# Patient Record
Sex: Male | Born: 1985 | Race: Black or African American | Hispanic: No | Marital: Married | Smoking: Former smoker
Health system: Southern US, Community
[De-identification: ages and names within clinical notes are randomized; demographics above are authoritative.]

## PROBLEM LIST (undated history)

## (undated) ENCOUNTER — Emergency Department: Discharge status: Absent without leave | Payer: BLUE CROSS/BLUE SHIELD

## (undated) DIAGNOSIS — J189 Pneumonia, unspecified organism: Secondary | ICD-10-CM

## (undated) DIAGNOSIS — R112 Nausea with vomiting, unspecified: Secondary | ICD-10-CM

## (undated) DIAGNOSIS — R310 Gross hematuria: Secondary | ICD-10-CM

## (undated) DIAGNOSIS — S7292XA Unspecified fracture of left femur, initial encounter for closed fracture: Secondary | ICD-10-CM

## (undated) DIAGNOSIS — Z9889 Other specified postprocedural states: Secondary | ICD-10-CM

## (undated) HISTORY — PX: FRACTURE SURGERY: SHX138

## (undated) HISTORY — PX: HERNIA REPAIR: SHX51

## (undated) SURGERY — Surgical Case
Anesthesia: *Unknown

---

## 2005-12-07 ENCOUNTER — Emergency Department: Payer: Self-pay | Admitting: Emergency Medicine

## 2006-05-02 ENCOUNTER — Emergency Department: Payer: Self-pay | Admitting: Emergency Medicine

## 2012-10-15 ENCOUNTER — Emergency Department: Payer: Self-pay | Admitting: Emergency Medicine

## 2012-10-15 LAB — URINALYSIS, COMPLETE
Bacteria: NONE SEEN
Blood: NEGATIVE
Glucose,UR: NEGATIVE mg/dL (ref 0–75)
Ketone: NEGATIVE
Protein: NEGATIVE
Specific Gravity: 1.019 (ref 1.003–1.030)
WBC UR: <1 /HPF (ref 0–5)

## 2012-10-15 LAB — BASIC METABOLIC PANEL
Anion Gap: 4 — ABNORMAL LOW (ref 7–16)
BUN: 17 mg/dL (ref 7–18)
Calcium, Total: 9.6 mg/dL (ref 8.5–10.1)
Co2: 29 mmol/L (ref 21–32)
Creatinine: 1.08 mg/dL (ref 0.60–1.30)
EGFR (Non-African Amer.): >60
Potassium: 4.3 mmol/L (ref 3.5–5.1)
Sodium: 136 mmol/L (ref 136–145)

## 2012-10-15 LAB — CBC
HCT: 44.1 % (ref 40.0–52.0)
HGB: 14.8 g/dL (ref 13.0–18.0)
MCH: 28.8 pg (ref 26.0–34.0)
Platelet: 230 10*3/uL (ref 150–440)
WBC: 11 10*3/uL — ABNORMAL HIGH (ref 3.8–10.6)

## 2012-10-25 ENCOUNTER — Emergency Department: Payer: Self-pay | Admitting: Emergency Medicine

## 2012-10-25 LAB — URINALYSIS, COMPLETE
Bacteria: NONE SEEN
Bilirubin,UR: NEGATIVE
Glucose,UR: NEGATIVE mg/dL (ref 0–75)
Ketone: NEGATIVE
Leukocyte Esterase: NEGATIVE
Nitrite: NEGATIVE
Protein: NEGATIVE
RBC,UR: 1 /HPF (ref 0–5)
Squamous Epithelial: NONE SEEN

## 2012-10-25 LAB — BASIC METABOLIC PANEL
Anion Gap: 2 — ABNORMAL LOW (ref 7–16)
Calcium, Total: 9.6 mg/dL (ref 8.5–10.1)
Chloride: 104 mmol/L (ref 98–107)
Co2: 32 mmol/L (ref 21–32)
Creatinine: 1.03 mg/dL (ref 0.60–1.30)
EGFR (African American): >60
EGFR (Non-African Amer.): >60
Glucose: 78 mg/dL (ref 65–99)
Osmolality: 276 (ref 275–301)
Potassium: 4.3 mmol/L (ref 3.5–5.1)

## 2012-10-25 LAB — CBC WITH DIFFERENTIAL/PLATELET
Basophil #: 0.1 10*3/uL (ref 0.0–0.1)
Basophil %: 0.8 %
Eosinophil %: 5.1 %
HCT: 44.1 % (ref 40.0–52.0)
HGB: 15 g/dL (ref 13.0–18.0)
Lymphocyte %: 36.8 %
MCHC: 34.1 g/dL (ref 32.0–36.0)
MCV: 86 fL (ref 80–100)
Monocyte #: 0.6 x10 3/mm (ref 0.2–1.0)
Monocyte %: 5.9 %
Neutrophil %: 51.4 %
Platelet: 278 10*3/uL (ref 150–440)
RDW: 13.4 % (ref 11.5–14.5)
WBC: 9.3 10*3/uL (ref 3.8–10.6)

## 2014-01-19 HISTORY — PX: INGUINAL HERNIA REPAIR: SUR1180

## 2017-04-10 ENCOUNTER — Other Ambulatory Visit: Payer: Self-pay

## 2017-04-10 ENCOUNTER — Ambulatory Visit
Admission: EM | Admit: 2017-04-10 | Discharge: 2017-04-10 | Disposition: A | Discharge status: Home / Self Care | Payer: BLUE CROSS/BLUE SHIELD | Attending: Emergency Medicine | Admitting: Emergency Medicine

## 2017-04-10 ENCOUNTER — Encounter: Payer: Self-pay | Admitting: *Deleted

## 2017-04-10 DIAGNOSIS — J111 Influenza due to unidentified influenza virus with other respiratory manifestations: Secondary | ICD-10-CM

## 2017-04-10 DIAGNOSIS — R69 Illness, unspecified: Secondary | ICD-10-CM | POA: Diagnosis not present

## 2017-04-10 DIAGNOSIS — R05 Cough: Secondary | ICD-10-CM | POA: Diagnosis not present

## 2017-04-10 DIAGNOSIS — M791 Myalgia, unspecified site: Secondary | ICD-10-CM

## 2017-04-10 DIAGNOSIS — R509 Fever, unspecified: Secondary | ICD-10-CM

## 2017-04-10 MED ORDER — ACETAMINOPHEN 500 MG PO TABS
1000.0000 mg | ORAL_TABLET | Freq: Once | ORAL | Status: AC
Start: 2017-04-10 — End: 2017-04-10
  Administered 2017-04-10: 1000 mg via ORAL

## 2017-04-10 MED ORDER — OSELTAMIVIR PHOSPHATE 75 MG PO CAPS: 75.0000 mg | ORAL_CAPSULE | Freq: Two times a day (BID) | ORAL | 0 refills | Status: DC

## 2017-04-10 MED ORDER — FLUTICASONE PROPIONATE 50 MCG/ACT NA SUSP: 1.0000 | Freq: Every day | NASAL | 2 refills | Status: DC

## 2017-04-10 NOTE — Discharge Instructions (Signed)
-  Tamiflu: 1 tablet twice a day for 5 days -Flonase: 1 spray per nostril daily -Push fluids and rest -Alternate ibuprofen and Tylenol every 3-4 hours for pain and fever -Can continue over-the-counter medications for symptom management -Return to clinic should symptoms worsen or not improve.

## 2017-04-10 NOTE — ED Triage Notes (Signed)
Patient started having symptoms of cough chest congestion, body aches, fever and nausea 2 days ago.

## 2017-04-10 NOTE — ED Provider Notes (Signed)
[facility]    CSN: [id number] Arrival date & time: 04/10/17  1052     History   Chief Complaint Chief Complaint  Patient presents with  . Cough  . Generalized Body Aches  . Fever    HPI Wesley Sandoval is a 32 y.o. male.   Patient is a 32 year old male who presents with complaint of stomachache, body aches, chills, nausea, headache, and congestion started about a day and a half ago.  Also had some ear pain with opening closing his mouth earlier but that is better.  He states he did not get the flu shot this year and he denies any known sick contacts.  He does report some sternal chest pain with cough and deep breaths.  Patient also reports some abdominal pain off and on and states he did have some diarrhea this morning.  He did take some DayQuil yesterday without much help.  He also reporting he is going from chills sweating and back-and-forth.      History reviewed. No pertinent past medical history.  There are no active problems to display for this patient.   Past Surgical History:  Procedure Laterality Date  . HERNIA REPAIR         Home Medications    Prior to Admission medications   Medication Sig Start Date End Date Taking? Authorizing Provider  fluticasone (FLONASE) 50 MCG/ACT nasal spray Place 1 spray into both nostrils daily. 04/10/17   Candis SchatzHarris, Kaylenn Civil D, PA-C  oseltamivir (TAMIFLU) 75 MG capsule Take 1 capsule (75 mg total) by mouth every 12 (twelve) hours. 04/10/17   Candis SchatzHarris, Marwa Fuhrman D, PA-C    Family History Family History  Adopted: Yes    Social History Social History   Tobacco Use  . Smoking status: Former Games developermoker  . Smokeless tobacco: Never Used  Substance Use Topics  . Alcohol use: No    Frequency: Never  . Drug use: No     Allergies   Patient has no known allergies.   Review of Systems Review of Systems  As noted above in HPI.  Other systems reviewed and found to be negative   Physical Exam Triage Vital Signs ED  Triage Vitals [04/10/17 1111]  Enc Vitals Group     BP 124/86     Pulse Rate (!) 107     Resp 16     Temp (!) 101.2 F (38.4 C)     Temp Source Oral     SpO2 98 %     Weight 220 lb (99.8 kg)     Height 6' (1.829 m)     Head Circumference      Peak Flow      Pain Score      Pain Loc      Pain Edu?      Excl. in GC?    No data found.  Updated Vital Signs BP 124/86 (BP Location: Right Arm)   Pulse (!) 107   Temp (!) 101.2 F (38.4 C) (Oral)   Resp 16   Ht 6' (1.829 m)   Wt 220 lb (99.8 kg)   SpO2 98%   BMI 29.84 kg/m    Physical Exam  Constitutional: He is oriented to person, place, and time. He appears well-developed. No distress.  HENT:  Head: Atraumatic.  Right Ear: Ear canal normal. A middle ear effusion is present.  Left Ear: Ear canal normal. A middle ear effusion is present.  Nose: Right sinus exhibits maxillary sinus tenderness.  Right sinus exhibits no frontal sinus tenderness. Left sinus exhibits maxillary sinus tenderness. Left sinus exhibits no frontal sinus tenderness.  Mouth/Throat: Uvula is midline. Tonsils are 0 on the right. Tonsils are 0 on the left.  Some postnasal drainage.  Eyes: EOM are normal. Pupils are equal, round, and reactive to light.  Cardiovascular: Regular rhythm.  Tachycardia  Pulmonary/Chest: Effort normal and breath sounds normal. No stridor. No respiratory distress. He has no wheezes.  Abdominal: Soft.  Musculoskeletal: Normal range of motion.  Lymphadenopathy:    He has cervical adenopathy.  Neurological: He is alert and oriented to person, place, and time. No cranial nerve deficit.  Skin: Skin is warm and dry.  Psychiatric: He has a normal mood and affect. His behavior is normal.     UC Treatments / Results  Labs (all labs ordered are listed, but only abnormal results are displayed) Labs Reviewed - No data to display  EKG  EKG Interpretation None       Radiology No results found.  Procedures Procedures  (including critical care time)  Medications Ordered in UC Medications  acetaminophen (TYLENOL) tablet 1,000 mg (1,000 mg Oral Given 04/10/17 1206)     Initial Impression / Assessment and Plan / UC Course  I have reviewed the triage vital signs and the nursing notes.  Pertinent labs & imaging results that were available during my care of the patient were reviewed by me and considered in my medical decision making (see chart for details).     Patient with flulike symptoms as well as signs upon examination.  Patient with noted fever in clinic today.  Final Clinical Impressions(s) / UC Diagnoses   Final diagnoses:  Influenza-like illness  Fever, unspecified   Will treat patient for flulike illness with Tamiflu and also give him a prescription for Flonase.  Recommend continuing rotation of ibuprofen and Tylenol for pain and fever.  Advised rest and pushing of fluids.  Patient advised to return to clinic should his symptoms worsen or not improve  ED Discharge Orders        Ordered    oseltamivir (TAMIFLU) 75 MG capsule  Every 12 hours     04/10/17 1207    fluticasone (FLONASE) 50 MCG/ACT nasal spray  Daily     04/10/17 1207       Controlled Substance Prescriptions [place] Controlled Substance Registry consulted? Not Applicable   Candis Schatz, PA-C 04/10/17 1210

## 2017-04-13 ENCOUNTER — Telehealth: Payer: Self-pay

## 2017-04-13 NOTE — Telephone Encounter (Signed)
Called to follow up with patient since visit here at [facility]. Patient instructed to call back with any questions or concerns. [facility]  

## 2017-04-21 DIAGNOSIS — R310 Gross hematuria: Secondary | ICD-10-CM

## 2017-04-21 HISTORY — DX: Gross hematuria: R31.0

## 2017-05-14 ENCOUNTER — Ambulatory Visit
Admission: EM | Admit: 2017-05-14 | Discharge: 2017-05-14 | Disposition: A | Discharge status: Home / Self Care | Payer: BLUE CROSS/BLUE SHIELD | Attending: Family Medicine | Admitting: Family Medicine

## 2017-05-14 ENCOUNTER — Encounter: Payer: Self-pay | Admitting: Emergency Medicine

## 2017-05-14 ENCOUNTER — Other Ambulatory Visit: Payer: Self-pay

## 2017-05-14 ENCOUNTER — Ambulatory Visit
Admission: RE | Admit: 2017-05-14 | Discharge: 2017-05-14 | Disposition: A | Discharge status: Home / Self Care | Payer: BLUE CROSS/BLUE SHIELD | Source: Ambulatory Visit | Attending: Family Medicine | Admitting: Family Medicine

## 2017-05-14 DIAGNOSIS — R0789 Other chest pain: Secondary | ICD-10-CM

## 2017-05-14 DIAGNOSIS — R05 Cough: Secondary | ICD-10-CM | POA: Diagnosis not present

## 2017-05-14 DIAGNOSIS — R079 Chest pain, unspecified: Secondary | ICD-10-CM | POA: Diagnosis not present

## 2017-05-14 DIAGNOSIS — R310 Gross hematuria: Secondary | ICD-10-CM | POA: Insufficient documentation

## 2017-05-14 DIAGNOSIS — Z9889 Other specified postprocedural states: Secondary | ICD-10-CM | POA: Insufficient documentation

## 2017-05-14 DIAGNOSIS — K5730 Diverticulosis of large intestine without perforation or abscess without bleeding: Secondary | ICD-10-CM | POA: Insufficient documentation

## 2017-05-14 DIAGNOSIS — Z87891 Personal history of nicotine dependence: Secondary | ICD-10-CM | POA: Insufficient documentation

## 2017-05-14 DIAGNOSIS — Z8489 Family history of other specified conditions: Secondary | ICD-10-CM | POA: Insufficient documentation

## 2017-05-14 DIAGNOSIS — K381 Appendicular concretions: Secondary | ICD-10-CM | POA: Diagnosis not present

## 2017-05-14 LAB — CBC WITH DIFFERENTIAL/PLATELET
Basophils Absolute: 0.1 10*3/uL (ref 0–0.1)
Basophils Relative: 1 %
Eosinophils Absolute: 0.1 10*3/uL (ref 0–0.7)
Eosinophils Relative: 2 %
HCT: 46.3 % (ref 40.0–52.0)
HEMOGLOBIN: 15.8 g/dL (ref 13.0–18.0)
LYMPHS ABS: 2 10*3/uL (ref 1.0–3.6)
LYMPHS PCT: 34 %
MCH: 29.6 pg (ref 26.0–34.0)
MCHC: 34.1 g/dL (ref 32.0–36.0)
MCV: 86.7 fL (ref 80.0–100.0)
MONOS PCT: 8 %
Monocytes Absolute: 0.4 10*3/uL (ref 0.2–1.0)
NEUTROS PCT: 55 %
Neutro Abs: 3.2 10*3/uL (ref 1.4–6.5)
Platelets: 214 10*3/uL (ref 150–440)
RBC: 5.33 MIL/uL (ref 4.40–5.90)
RDW: 13.5 % (ref 11.5–14.5)
WBC: 5.8 10*3/uL (ref 3.8–10.6)

## 2017-05-14 LAB — URINALYSIS, COMPLETE (UACMP) WITH MICROSCOPIC
BILIRUBIN URINE: NEGATIVE
Glucose, UA: NEGATIVE mg/dL
Ketones, ur: NEGATIVE mg/dL
LEUKOCYTES UA: NEGATIVE
NITRITE: NEGATIVE
PH: 5.5 (ref 5.0–8.0)
Protein, ur: 30 mg/dL — AB
SPECIFIC GRAVITY, URINE: 1.025 (ref 1.005–1.030)
WBC, UA: NONE SEEN WBC/hpf (ref 0–5)

## 2017-05-14 LAB — COMPREHENSIVE METABOLIC PANEL
ALK PHOS: 60 U/L (ref 38–126)
ALT: 24 U/L (ref 17–63)
ANION GAP: 5 (ref 5–15)
AST: 22 U/L (ref 15–41)
Albumin: 4.2 g/dL (ref 3.5–5.0)
BUN: 15 mg/dL (ref 6–20)
CALCIUM: 9.2 mg/dL (ref 8.9–10.3)
CO2: 26 mmol/L (ref 22–32)
CREATININE: 1.08 mg/dL (ref 0.61–1.24)
Chloride: 104 mmol/L (ref 101–111)
Glucose, Bld: 96 mg/dL (ref 65–99)
Potassium: 3.9 mmol/L (ref 3.5–5.1)
Sodium: 135 mmol/L (ref 135–145)
Total Bilirubin: 0.8 mg/dL (ref 0.3–1.2)
Total Protein: 7.2 g/dL (ref 6.5–8.1)

## 2017-05-14 NOTE — Discharge Instructions (Signed)
We will call with the results.  Tylenol/ibuprofen as needed for you chest discomfort.  Take care  Dr. Adriana Simasook

## 2017-05-14 NOTE — ED Triage Notes (Signed)
Patient states that he noticed blood in his urine this morning.  Patient denies burning when urinating. Patient c/o cough and chest congestion for 3 weeks.  Patient denies fevers.

## 2017-05-14 NOTE — ED Provider Notes (Signed)
[facility]    CSN: [id number] Arrival date & time: 05/14/17  1214  History   Chief Complaint Chief Complaint  Patient presents with  . Hematuria  . Cough    HPI  32 year old male presents with hematuria and chest pain.  Patient reports that he has had hematuria as of early this morning.  Gross hematuria.  No reports of current flank pain.  He states that last week he had a bout of left flank pain.  No urinary frequency, urgency, or dysuria.  No drainage from the penis.  He is not concerned about STDs.  No reports of abdominal pain.  No known exacerbating or relieving factors.  No other associated symptoms.  Additionally, the patient has had ongoing chest pain.  Sternal.  Worse with range of motion and leaning forward, particular in the morning.  Intermittent. No pain currently.  No known relieving factors.  No reports of shortness of breath.  No other associated symptoms.  No other complaints at this time.  PMH - Hx of Pneumonia  Past Surgical History:  Procedure Laterality Date  . HERNIA REPAIR      Home Medications    Prior to Admission medications   Not on File    Family History Family History  Adopted: Yes    Social History Social History   Tobacco Use  . Smoking status: Former Games developermoker  . Smokeless tobacco: Never Used  Substance Use Topics  . Alcohol use: No    Frequency: Never  . Drug use: No     Allergies   Patient has no known allergies.   Review of Systems Review of Systems  Constitutional: Negative.   Cardiovascular: Positive for chest pain.  Genitourinary: Positive for hematuria.   Physical Exam Triage Vital Signs ED Triage Vitals  Enc Vitals Group     BP 05/14/17 1240 116/74     Pulse Rate 05/14/17 1240 70     Resp 05/14/17 1240 16     Temp 05/14/17 1240 98.7 F (37.1 C)     Temp Source 05/14/17 1240 Oral     SpO2 05/14/17 1240 99 %     Weight 05/14/17 1238 200 lb (90.7 kg)     Height 05/14/17 1238 6' (1.829 m)   Head Circumference --      Peak Flow --      Pain Score 05/14/17 1238 2     Pain Loc --      Pain Edu? --      Excl. in GC? --    Updated Vital Signs BP 116/74 (BP Location: Left Arm)   Pulse 70   Temp 98.7 F (37.1 C) (Oral)   Resp 16   Ht 6' (1.829 m)   Wt 200 lb (90.7 kg)   SpO2 99%   BMI 27.12 kg/m     Physical Exam  Constitutional: He is oriented to person, place, and time. He appears well-developed and well-nourished. No distress.  HENT:  Head: Normocephalic and atraumatic.  Eyes: Conjunctivae are normal. Right eye exhibits no discharge. Left eye exhibits no discharge.  Cardiovascular: Normal rate and regular rhythm.  No murmur heard. Pulmonary/Chest: Effort normal and breath sounds normal. No respiratory distress. He exhibits no tenderness.  Abdominal: Soft. He exhibits no distension. There is no tenderness.  Neurological: He is alert and oriented to person, place, and time.  Psychiatric: He has a normal mood and affect. His behavior is normal.  Nursing note and vitals reviewed.  UC Treatments /  Results  Labs (all labs ordered are listed, but only abnormal results are displayed) Labs Reviewed  URINALYSIS, COMPLETE (UACMP) WITH MICROSCOPIC - Abnormal; Notable for the following components:      Result Value   Hgb urine dipstick LARGE (*)    Protein, ur 30 (*)    Squamous Epithelial / LPF 0-5 (*)    Bacteria, UA RARE (*)    All other components within normal limits  CBC WITH DIFFERENTIAL/PLATELET  COMPREHENSIVE METABOLIC PANEL    EKG Interpretation: Normal sinus rhythm with a rate of 67.  Normal axis.  Normal intervals.  No ST or T wave changes.  Normal EKG.  Radiology No results found.  Procedures Procedures (including critical care time)  Medications Ordered in UC Medications - No data to display   Initial Impression / Assessment and Plan / UC Course  I have reviewed the triage vital signs and the nursing notes.  Pertinent labs & imaging results  that were available during my care of the patient were reviewed by me and considered in my medical decision making (see chart for details).    32 year old male presents with gross hematuria.  He also reports ongoing chest pain.  EKG negative.  Does not appear to be cardiac in nature.  Likely musculoskeletal.  Advised supportive care.  Regarding his hematuria gross hematuria.  Confirmed hematuria today on microscopy.  Patient is currently asymptomatic.  He has had recent flank pain.  Per guidelines, patient needs further workup.  His labs today were unremarkable.  Patient will need a CT and cystoscopy if CT is negative.  Arranging CT today.  Final Clinical Impressions(s) / UC Diagnoses   Final diagnoses:  Gross hematuria    ED Discharge Orders        Ordered    CT RENAL STONE STUDY     05/14/17 1342     Controlled Substance Prescriptions [place] Controlled Substance Registry consulted? Not Applicable   Tommie Sams, DO 05/14/17 1347

## 2017-05-15 NOTE — ED Notes (Signed)
Called for CT Authorization. No Authorization required for procedure. [facility]

## 2018-01-02 ENCOUNTER — Emergency Department
Admission: EM | Admit: 2018-01-02 | Discharge: 2018-01-02 | Disposition: A | Discharge status: Home / Self Care | Payer: BLUE CROSS/BLUE SHIELD | Attending: Student in an Organized Health Care Education/Training Program | Admitting: Student in an Organized Health Care Education/Training Program

## 2018-01-02 ENCOUNTER — Other Ambulatory Visit: Payer: Self-pay

## 2018-01-02 DIAGNOSIS — J029 Acute pharyngitis, unspecified: Secondary | ICD-10-CM | POA: Diagnosis not present

## 2018-01-02 DIAGNOSIS — J069 Acute upper respiratory infection, unspecified: Secondary | ICD-10-CM

## 2018-01-02 DIAGNOSIS — B9789 Other viral agents as the cause of diseases classified elsewhere: Secondary | ICD-10-CM | POA: Insufficient documentation

## 2018-01-02 DIAGNOSIS — Z87891 Personal history of nicotine dependence: Secondary | ICD-10-CM | POA: Diagnosis not present

## 2018-01-02 DIAGNOSIS — R070 Pain in throat: Secondary | ICD-10-CM | POA: Diagnosis present

## 2018-01-02 LAB — GROUP A STREP BY PCR: GROUP A STREP BY PCR: NOT DETECTED

## 2018-01-02 MED ORDER — PREDNISONE 10 MG (21) PO TBPK: ORAL_TABLET | ORAL | 0 refills | Status: DC

## 2018-01-02 MED ORDER — FLUTICASONE PROPIONATE 50 MCG/ACT NA SUSP: 2.0000 | Freq: Every day | NASAL | 0 refills | Status: DC

## 2018-01-02 MED ORDER — ACETAMINOPHEN 325 MG PO TABS
650.0000 mg | ORAL_TABLET | Freq: Once | ORAL | Status: AC | PRN
  Administered 2018-01-02: 650 mg via ORAL
  Filled 2018-01-02: qty 2

## 2018-01-02 MED ORDER — BENZONATATE 100 MG PO CAPS: ORAL_CAPSULE | ORAL | 0 refills | Status: DC

## 2018-01-02 MED ORDER — ONDANSETRON 4 MG PO TBDP
4.0000 mg | ORAL_TABLET | Freq: Once | ORAL | Status: AC
  Administered 2018-01-02: 4 mg via ORAL
  Filled 2018-01-02: qty 1

## 2018-01-02 MED ORDER — DEXAMETHASONE SODIUM PHOSPHATE 10 MG/ML IJ SOLN
10.0000 mg | Freq: Once | INTRAMUSCULAR | Status: AC
  Administered 2018-01-02: 10 mg via INTRAMUSCULAR
  Filled 2018-01-02: qty 1

## 2018-01-02 NOTE — ED Triage Notes (Signed)
Pt states sore throat, nausea, congestion, cold chills x 1 day. Took dayquil at 3p. States painful to swallow. A&O x 4, ambulatory. No distress noted.

## 2018-01-02 NOTE — Discharge Instructions (Addendum)
Your exam was essentially normal, and your strep test is negative. You likely have a viral upper respiratory infection. Take the prescription meds as directed. You may continue your OTC DayQuil as well as Benadryl as discussed. Consider OTC Delsym for additional cough relief. Follow-up with [facility] for ongoing symptoms.

## 2018-01-02 NOTE — ED Provider Notes (Signed)
[facility] Emergency Department Provider Note ____________________________________________  Time seen: 2015  I have reviewed the triage vital signs and the nursing notes.  HISTORY  Chief Complaint  Sore Throat  HPI Wesley Sandoval is a 32 y.o. male resents himself to the ED for evaluation of 1 day complaint of subjective fevers, cough, congestion, and sore throat.  Patient denies any sick contacts, recent travel, or other exposures.  He describes itchy scratchy posterior throat and postnasal drainage.  He denies any significant cough, cough induced vomiting, or shortness of breath.  He has taken 2 pills of DayQuil for symptom relief.  He denies any significant improvement of his overall symptoms.  History reviewed. No pertinent past medical history.  There are no active problems to display for this patient.  Past Surgical History:  Procedure Laterality Date  . HERNIA REPAIR      Prior to Admission medications   Medication Sig Start Date End Date Taking? Authorizing Provider  benzonatate (TESSALON PERLES) 100 MG capsule Take 1-2 tabs TID prn cough 01/02/18   Reba Hulett, Charlesetta Ivory, PA-C  fluticasone (FLONASE) 50 MCG/ACT nasal spray Place 2 sprays into both nostrils daily. 01/02/18   Imad Shostak, Charlesetta Ivory, PA-C  predniSONE (STERAPRED UNI-PAK 21 TAB) 10 MG (21) TBPK tablet 6-day taper as directed. 01/02/18   Clarece Drzewiecki, Charlesetta Ivory, PA-C    Allergies Patient has no known allergies.  Family History  Adopted: Yes    Social History Social History   Tobacco Use  . Smoking status: Former Games developer  . Smokeless tobacco: Never Used  Substance Use Topics  . Alcohol use: No    Frequency: Never  . Drug use: No    Review of Systems  Constitutional: Negative for fever. Eyes: Negative for visual changes. ENT: Positive for sore throat. Cardiovascular: Negative for chest pain. Respiratory: Negative for shortness of breath. Reports cough  Gastrointestinal:  Negative for abdominal pain, vomiting and diarrhea. Genitourinary: Negative for dysuria. Musculoskeletal: Negative for back pain. Skin: Negative for rash. Neurological: Negative for headaches, focal weakness or numbness. ____________________________________________  PHYSICAL EXAM:  VITAL SIGNS: ED Triage Vitals [01/02/18 1829]  Enc Vitals Group     BP 117/70     Pulse Rate (!) 110     Resp 18     Temp 100 F (37.8 C)     Temp Source Oral     SpO2 97 %     Weight 220 lb (99.8 kg)     Height 5\' 9"  (1.753 m)     Head Circumference      Peak Flow      Pain Score 8     Pain Loc      Pain Edu?      Excl. in GC?     Constitutional: Alert and oriented. Well appearing and in no distress. Head: Normocephalic and atraumatic. Eyes: Conjunctivae are normal. PERRL. Normal extraocular movements Ears: Canals clear. TMs intact bilaterally. Nose: No congestion/rhinorrhea/epistaxis. Nasal turbinates are pink and moist.  Mouth/Throat: Mucous membranes are moist. Uvula is midline and tonsils are flat and without erythema, edema, or exudates. Neck: Supple. No thyromegaly. Hematological/Lymphatic/Immunological: No cervical lymphadenopathy. Cardiovascular: Normal rate, regular rhythm. Normal distal pulses. Respiratory: Normal respiratory effort. No wheezes/rales/rhonchi. ____________________________________________   LABS (pertinent positives/negatives)  Labs Reviewed  GROUP A STREP BY PCR  ____________________________________________  PROCEDURES  Procedures Tylenol 650 mg PO Decadron 10 mg IM Zofran 4 mg ODT  ____________________________________________  INITIAL IMPRESSION / ASSESSMENT AND PLAN /  ED COURSE  Patient with ED evaluation of sudden onset of sore throat and post-nasal drainage. His strep PCR is negative. Symptoms likely represent a viral etiology. He will be discharged with prescriptions for tessalon perles, Flonase, and prednisone. He should take OTC Delsym and Benadryl  for additional symptom relief.  ____________________________________________  FINAL CLINICAL IMPRESSION(S) / ED DIAGNOSES  Final diagnoses:  Sore throat  Viral URI with cough      Meaghann Choo, Charlesetta Ivory, PA-C 01/03/18 0004    Willy Eddy, MD 01/05/18 [phone]

## 2018-01-02 NOTE — ED Notes (Signed)
See triage note  Presents with sore throat,nausea and body aches  States sx's started last pm  Febrile on arrival

## 2018-01-13 ENCOUNTER — Emergency Department (HOSPITAL_COMMUNITY): Payer: BLUE CROSS/BLUE SHIELD | Admitting: Anesthesiology

## 2018-01-13 ENCOUNTER — Encounter (HOSPITAL_COMMUNITY): Admission: EM | Disposition: A | Discharge status: Home Health | Payer: Self-pay | Source: Home / Self Care

## 2018-01-13 ENCOUNTER — Encounter (HOSPITAL_COMMUNITY): Payer: Self-pay | Admitting: Emergency Medicine

## 2018-01-13 ENCOUNTER — Inpatient Hospital Stay (HOSPITAL_COMMUNITY): Payer: BLUE CROSS/BLUE SHIELD

## 2018-01-13 ENCOUNTER — Emergency Department (HOSPITAL_COMMUNITY): Payer: BLUE CROSS/BLUE SHIELD

## 2018-01-13 ENCOUNTER — Inpatient Hospital Stay (HOSPITAL_COMMUNITY)
Admission: EM | Admit: 2018-01-13 | Discharge: 2018-01-25 | DRG: 956 | Disposition: A | Discharge status: Home Health | Payer: BLUE CROSS/BLUE SHIELD | Attending: General Surgery | Admitting: General Surgery

## 2018-01-13 DIAGNOSIS — S272XXA Traumatic hemopneumothorax, initial encounter: Secondary | ICD-10-CM | POA: Diagnosis not present

## 2018-01-13 DIAGNOSIS — Z23 Encounter for immunization: Secondary | ICD-10-CM | POA: Diagnosis not present

## 2018-01-13 DIAGNOSIS — S27322A Contusion of lung, bilateral, initial encounter: Secondary | ICD-10-CM | POA: Diagnosis present

## 2018-01-13 DIAGNOSIS — E8770 Fluid overload, unspecified: Secondary | ICD-10-CM | POA: Diagnosis not present

## 2018-01-13 DIAGNOSIS — I959 Hypotension, unspecified: Secondary | ICD-10-CM | POA: Diagnosis present

## 2018-01-13 DIAGNOSIS — G8918 Other acute postprocedural pain: Secondary | ICD-10-CM

## 2018-01-13 DIAGNOSIS — N179 Acute kidney failure, unspecified: Secondary | ICD-10-CM | POA: Diagnosis present

## 2018-01-13 DIAGNOSIS — E875 Hyperkalemia: Secondary | ICD-10-CM | POA: Diagnosis present

## 2018-01-13 DIAGNOSIS — D62 Acute posthemorrhagic anemia: Secondary | ICD-10-CM

## 2018-01-13 DIAGNOSIS — R000 Tachycardia, unspecified: Secondary | ICD-10-CM | POA: Diagnosis not present

## 2018-01-13 DIAGNOSIS — J189 Pneumonia, unspecified organism: Secondary | ICD-10-CM | POA: Diagnosis not present

## 2018-01-13 DIAGNOSIS — E876 Hypokalemia: Secondary | ICD-10-CM | POA: Diagnosis not present

## 2018-01-13 DIAGNOSIS — S82202C Unspecified fracture of shaft of left tibia, initial encounter for open fracture type IIIA, IIIB, or IIIC: Secondary | ICD-10-CM | POA: Diagnosis present

## 2018-01-13 DIAGNOSIS — S72462C Displaced supracondylar fracture with intracondylar extension of lower end of left femur, initial encounter for open fracture type IIIA, IIIB, or IIIC: Principal | ICD-10-CM | POA: Diagnosis present

## 2018-01-13 DIAGNOSIS — S82401A Unspecified fracture of shaft of right fibula, initial encounter for closed fracture: Secondary | ICD-10-CM

## 2018-01-13 DIAGNOSIS — T148XXA Other injury of unspecified body region, initial encounter: Secondary | ICD-10-CM | POA: Diagnosis not present

## 2018-01-13 DIAGNOSIS — Y95 Nosocomial condition: Secondary | ICD-10-CM

## 2018-01-13 DIAGNOSIS — R739 Hyperglycemia, unspecified: Secondary | ICD-10-CM | POA: Diagnosis not present

## 2018-01-13 DIAGNOSIS — S82202B Unspecified fracture of shaft of left tibia, initial encounter for open fracture type I or II: Secondary | ICD-10-CM

## 2018-01-13 DIAGNOSIS — D696 Thrombocytopenia, unspecified: Secondary | ICD-10-CM | POA: Diagnosis not present

## 2018-01-13 DIAGNOSIS — Z4659 Encounter for fitting and adjustment of other gastrointestinal appliance and device: Secondary | ICD-10-CM

## 2018-01-13 DIAGNOSIS — S82252C Displaced comminuted fracture of shaft of left tibia, initial encounter for open fracture type IIIA, IIIB, or IIIC: Secondary | ICD-10-CM

## 2018-01-13 DIAGNOSIS — M7989 Other specified soft tissue disorders: Secondary | ICD-10-CM | POA: Diagnosis not present

## 2018-01-13 DIAGNOSIS — Z7289 Other problems related to lifestyle: Secondary | ICD-10-CM | POA: Diagnosis not present

## 2018-01-13 DIAGNOSIS — S299XXA Unspecified injury of thorax, initial encounter: Secondary | ICD-10-CM

## 2018-01-13 DIAGNOSIS — F1010 Alcohol abuse, uncomplicated: Secondary | ICD-10-CM | POA: Diagnosis not present

## 2018-01-13 DIAGNOSIS — S82401B Unspecified fracture of shaft of right fibula, initial encounter for open fracture type I or II: Secondary | ICD-10-CM | POA: Diagnosis present

## 2018-01-13 DIAGNOSIS — D72829 Elevated white blood cell count, unspecified: Secondary | ICD-10-CM

## 2018-01-13 DIAGNOSIS — S7292XC Unspecified fracture of left femur, initial encounter for open fracture type IIIA, IIIB, or IIIC: Secondary | ICD-10-CM

## 2018-01-13 DIAGNOSIS — Z978 Presence of other specified devices: Secondary | ICD-10-CM

## 2018-01-13 DIAGNOSIS — S2243XS Multiple fractures of ribs, bilateral, sequela: Secondary | ICD-10-CM

## 2018-01-13 DIAGNOSIS — S2241XA Multiple fractures of ribs, right side, initial encounter for closed fracture: Secondary | ICD-10-CM

## 2018-01-13 DIAGNOSIS — Z9289 Personal history of other medical treatment: Secondary | ICD-10-CM

## 2018-01-13 DIAGNOSIS — T794XXA Traumatic shock, initial encounter: Secondary | ICD-10-CM | POA: Diagnosis present

## 2018-01-13 DIAGNOSIS — Z4682 Encounter for fitting and adjustment of non-vascular catheter: Secondary | ICD-10-CM

## 2018-01-13 DIAGNOSIS — S7292XA Unspecified fracture of left femur, initial encounter for closed fracture: Secondary | ICD-10-CM

## 2018-01-13 DIAGNOSIS — J9811 Atelectasis: Secondary | ICD-10-CM | POA: Diagnosis not present

## 2018-01-13 DIAGNOSIS — S82251B Displaced comminuted fracture of shaft of right tibia, initial encounter for open fracture type I or II: Secondary | ICD-10-CM | POA: Diagnosis present

## 2018-01-13 DIAGNOSIS — S82402B Unspecified fracture of shaft of left fibula, initial encounter for open fracture type I or II: Secondary | ICD-10-CM

## 2018-01-13 DIAGNOSIS — T1490XA Injury, unspecified, initial encounter: Secondary | ICD-10-CM

## 2018-01-13 DIAGNOSIS — S82201A Unspecified fracture of shaft of right tibia, initial encounter for closed fracture: Secondary | ICD-10-CM

## 2018-01-13 DIAGNOSIS — T07XXXA Unspecified multiple injuries, initial encounter: Secondary | ICD-10-CM

## 2018-01-13 DIAGNOSIS — J9601 Acute respiratory failure with hypoxia: Secondary | ICD-10-CM | POA: Diagnosis present

## 2018-01-13 DIAGNOSIS — Z419 Encounter for procedure for purposes other than remedying health state, unspecified: Secondary | ICD-10-CM

## 2018-01-13 DIAGNOSIS — S36892A Contusion of other intra-abdominal organs, initial encounter: Secondary | ICD-10-CM | POA: Diagnosis present

## 2018-01-13 DIAGNOSIS — Y92410 Unspecified street and highway as the place of occurrence of the external cause: Secondary | ICD-10-CM

## 2018-01-13 DIAGNOSIS — S2243XA Multiple fractures of ribs, bilateral, initial encounter for closed fracture: Secondary | ICD-10-CM | POA: Diagnosis present

## 2018-01-13 DIAGNOSIS — R0902 Hypoxemia: Secondary | ICD-10-CM

## 2018-01-13 DIAGNOSIS — J939 Pneumothorax, unspecified: Secondary | ICD-10-CM

## 2018-01-13 DIAGNOSIS — S99919A Unspecified injury of unspecified ankle, initial encounter: Secondary | ICD-10-CM

## 2018-01-13 HISTORY — DX: Pneumonia, unspecified organism: J18.9

## 2018-01-13 HISTORY — DX: Gross hematuria: R31.0

## 2018-01-13 HISTORY — PX: I & D EXTREMITY: SHX5045

## 2018-01-13 HISTORY — PX: EXTERNAL FIXATION LEG: SHX1549

## 2018-01-13 LAB — COMPREHENSIVE METABOLIC PANEL
ALK PHOS: 58 U/L (ref 38–126)
ALT: 341 U/L — ABNORMAL HIGH (ref 0–44)
AST: 416 U/L — AB (ref 15–41)
Albumin: 3.7 g/dL (ref 3.5–5.0)
Anion gap: 13 (ref 5–15)
BILIRUBIN TOTAL: 1.4 mg/dL — AB (ref 0.3–1.2)
BUN: 19 mg/dL (ref 6–20)
CO2: 19 mmol/L — ABNORMAL LOW (ref 22–32)
CREATININE: 1.62 mg/dL — AB (ref 0.61–1.24)
Calcium: 8.6 mg/dL — ABNORMAL LOW (ref 8.9–10.3)
Chloride: 107 mmol/L (ref 98–111)
GFR calc Af Amer: 28 mL/min — ABNORMAL LOW (ref >60)
GFR, EST NON AFRICAN AMERICAN: 24 mL/min — AB (ref >60)
Glucose, Bld: 163 mg/dL — ABNORMAL HIGH (ref 70–99)
Potassium: 3.7 mmol/L (ref 3.5–5.1)
Sodium: 139 mmol/L (ref 135–145)
Total Protein: 6.6 g/dL (ref 6.5–8.1)

## 2018-01-13 LAB — I-STAT CHEM 8, ED
BUN: 24 mg/dL — AB (ref 6–20)
CHLORIDE: 107 mmol/L (ref 98–111)
CREATININE: 1.8 mg/dL — AB (ref 0.61–1.24)
Calcium, Ion: 1.05 mmol/L — ABNORMAL LOW (ref 1.15–1.40)
Glucose, Bld: 160 mg/dL — ABNORMAL HIGH (ref 70–99)
HEMATOCRIT: 46 % (ref 39.0–52.0)
Hemoglobin: 15.6 g/dL (ref 13.0–17.0)
POTASSIUM: 3.4 mmol/L — AB (ref 3.5–5.1)
SODIUM: 142 mmol/L (ref 135–145)
TCO2: 21 mmol/L — ABNORMAL LOW (ref 22–32)

## 2018-01-13 LAB — URINALYSIS, ROUTINE W REFLEX MICROSCOPIC
BILIRUBIN URINE: NEGATIVE
Bacteria, UA: NONE SEEN
Glucose, UA: NEGATIVE mg/dL
Ketones, ur: NEGATIVE mg/dL
Leukocytes, UA: NEGATIVE
Nitrite: NEGATIVE
PH: 7 (ref 5.0–8.0)
Protein, ur: 30 mg/dL — AB
Specific Gravity, Urine: 1.004 — ABNORMAL LOW (ref 1.005–1.030)

## 2018-01-13 LAB — PROTIME-INR
INR: 1.02
Prothrombin Time: 13.4 seconds (ref 11.4–15.2)

## 2018-01-13 LAB — CBC
HCT: 47.5 % (ref 39.0–52.0)
Hemoglobin: 14.7 g/dL (ref 13.0–17.0)
MCH: 28.6 pg (ref 26.0–34.0)
MCHC: 30.9 g/dL (ref 30.0–36.0)
MCV: 92.4 fL (ref 80.0–100.0)
Platelets: 243 10*3/uL (ref 150–400)
RBC: 5.14 MIL/uL (ref 4.22–5.81)
RDW: 12.9 % (ref 11.5–15.5)
WBC: 11.4 10*3/uL — AB (ref 4.0–10.5)
nRBC: 0 % (ref 0.0–0.2)

## 2018-01-13 LAB — I-STAT CG4 LACTIC ACID, ED: Lactic Acid, Venous: 4.93 mmol/L (ref 0.5–1.9)

## 2018-01-13 LAB — LACTIC ACID, PLASMA: Lactic Acid, Venous: 4.8 mmol/L (ref 0.5–1.9)

## 2018-01-13 LAB — ETHANOL: ALCOHOL ETHYL (B): 144 mg/dL — AB (ref <10)

## 2018-01-13 SURGERY — EXTERNAL FIXATION, LOWER EXTREMITY
Anesthesia: General | Laterality: Right

## 2018-01-13 MED ORDER — CEFAZOLIN SODIUM-DEXTROSE 1-4 GM/50ML-% IV SOLN
1.0000 g | Freq: Three times a day (TID) | INTRAVENOUS | Status: DC
  Administered 2018-01-14 – 2018-01-15 (×5): 1 g via INTRAVENOUS
  Filled 2018-01-13 (×8): qty 50

## 2018-01-13 MED ORDER — FENTANYL CITRATE (PF) 100 MCG/2ML IJ SOLN
INTRAMUSCULAR | Status: AC
  Filled 2018-01-13: qty 2

## 2018-01-13 MED ORDER — SUCCINYLCHOLINE CHLORIDE 20 MG/ML IJ SOLN
INTRAMUSCULAR | Status: DC | PRN
  Administered 2018-01-13: 140 mg via INTRAVENOUS

## 2018-01-13 MED ORDER — PROPOFOL 1000 MG/100ML IV EMUL
INTRAVENOUS | Status: AC
  Filled 2018-01-13: qty 100

## 2018-01-13 MED ORDER — FENTANYL CITRATE (PF) 100 MCG/2ML IJ SOLN
INTRAMUSCULAR | Status: AC
  Administered 2018-01-13: 50 ug
  Filled 2018-01-13: qty 2

## 2018-01-13 MED ORDER — CLINDAMYCIN PHOSPHATE 900 MG/50ML IV SOLN
INTRAVENOUS | Status: AC
  Filled 2018-01-13: qty 50

## 2018-01-13 MED ORDER — FENTANYL BOLUS VIA INFUSION
50.0000 ug | INTRAVENOUS | Status: DC | PRN
  Administered 2018-01-17 – 2018-01-18 (×5): 50 ug via INTRAVENOUS
  Administered 2018-01-19: 100 ug via INTRAVENOUS
  Administered 2018-01-19: 50 ug via INTRAVENOUS
  Filled 2018-01-13 (×5): qty 50

## 2018-01-13 MED ORDER — SODIUM CHLORIDE 0.9 % IR SOLN
Status: DC | PRN
  Administered 2018-01-13: 3000 mL

## 2018-01-13 MED ORDER — SODIUM CHLORIDE 0.9 % IV BOLUS
20.0000 mL/kg | Freq: Once | INTRAVENOUS | Status: AC
  Administered 2018-01-13: 1000 mL via INTRAVENOUS

## 2018-01-13 MED ORDER — ARTIFICIAL TEARS OPHTHALMIC OINT
TOPICAL_OINTMENT | OPHTHALMIC | Status: DC | PRN
  Administered 2018-01-13: 1 via OPHTHALMIC

## 2018-01-13 MED ORDER — MIDAZOLAM HCL 2 MG/2ML IJ SOLN
INTRAMUSCULAR | Status: AC
  Filled 2018-01-13: qty 2

## 2018-01-13 MED ORDER — FENTANYL CITRATE (PF) 100 MCG/2ML IJ SOLN
100.0000 ug | Freq: Once | INTRAMUSCULAR | Status: AC
  Administered 2018-01-13: 100 ug via INTRAVENOUS

## 2018-01-13 MED ORDER — ROCURONIUM BROMIDE 50 MG/5ML IV SOSY
PREFILLED_SYRINGE | INTRAVENOUS | Status: AC
  Filled 2018-01-13: qty 10

## 2018-01-13 MED ORDER — FENTANYL CITRATE (PF) 100 MCG/2ML IJ SOLN
INTRAMUSCULAR | Status: DC | PRN
  Administered 2018-01-13 (×2): 50 ug via INTRAVENOUS
  Administered 2018-01-13: 100 ug via INTRAVENOUS
  Administered 2018-01-13 (×3): 50 ug via INTRAVENOUS

## 2018-01-13 MED ORDER — FENTANYL CITRATE (PF) 250 MCG/5ML IJ SOLN
INTRAMUSCULAR | Status: AC
  Filled 2018-01-13: qty 5

## 2018-01-13 MED ORDER — CEFAZOLIN SODIUM-DEXTROSE 2-4 GM/100ML-% IV SOLN
INTRAVENOUS | Status: AC
  Filled 2018-01-13: qty 100

## 2018-01-13 MED ORDER — PROPOFOL 10 MG/ML IV BOLUS
INTRAVENOUS | Status: DC | PRN
  Administered 2018-01-13: 110 mg via INTRAVENOUS

## 2018-01-13 MED ORDER — LIDOCAINE 2% (20 MG/ML) 5 ML SYRINGE
INTRAMUSCULAR | Status: DC | PRN
  Administered 2018-01-13: 50 mg via INTRAVENOUS

## 2018-01-13 MED ORDER — LACTATED RINGERS IV SOLN
INTRAVENOUS | Status: DC | PRN
  Administered 2018-01-13: 21:00:00 via INTRAVENOUS

## 2018-01-13 MED ORDER — TETANUS-DIPHTH-ACELL PERTUSSIS 5-2.5-18.5 LF-MCG/0.5 IM SUSP
INTRAMUSCULAR | Status: AC
  Filled 2018-01-13: qty 0.5

## 2018-01-13 MED ORDER — PROPOFOL 500 MG/50ML IV EMUL
INTRAVENOUS | Status: DC | PRN
  Administered 2018-01-13: 50 ug/kg/min via INTRAVENOUS

## 2018-01-13 MED ORDER — MIDAZOLAM HCL 5 MG/5ML IJ SOLN
INTRAMUSCULAR | Status: DC | PRN
  Administered 2018-01-13 (×2): 1 mg via INTRAVENOUS

## 2018-01-13 MED ORDER — FENTANYL 2500MCG IN NS 250ML (10MCG/ML) PREMIX INFUSION
25.0000 ug/h | INTRAVENOUS | Status: DC
  Administered 2018-01-14: 250 ug/h via INTRAVENOUS
  Administered 2018-01-14: 200 ug/h via INTRAVENOUS
  Administered 2018-01-14 – 2018-01-16 (×5): 300 ug/h via INTRAVENOUS
  Administered 2018-01-16 – 2018-01-17 (×4): 400 ug/h via INTRAVENOUS
  Administered 2018-01-17: 12:00:00 via INTRAVENOUS
  Administered 2018-01-17 – 2018-01-21 (×13): 400 ug/h via INTRAVENOUS
  Filled 2018-01-13 (×26): qty 250

## 2018-01-13 MED ORDER — ONDANSETRON HCL 4 MG/2ML IJ SOLN
INTRAMUSCULAR | Status: AC
  Filled 2018-01-13: qty 2

## 2018-01-13 MED ORDER — ONDANSETRON HCL 4 MG/2ML IJ SOLN
4.0000 mg | Freq: Once | INTRAMUSCULAR | Status: AC
  Administered 2018-01-13: 4 mg via INTRAVENOUS

## 2018-01-13 MED ORDER — DOCUSATE SODIUM 50 MG/5ML PO LIQD
100.0000 mg | Freq: Two times a day (BID) | ORAL | Status: DC | PRN
  Administered 2018-01-17: 100 mg
  Filled 2018-01-13: qty 10

## 2018-01-13 MED ORDER — CEFAZOLIN SODIUM-DEXTROSE 2-4 GM/100ML-% IV SOLN
2.0000 g | Freq: Once | INTRAVENOUS | Status: AC
  Administered 2018-01-13: 2 g via INTRAVENOUS

## 2018-01-13 MED ORDER — SUCCINYLCHOLINE CHLORIDE 200 MG/10ML IV SOSY
PREFILLED_SYRINGE | INTRAVENOUS | Status: AC
  Filled 2018-01-13: qty 10

## 2018-01-13 MED ORDER — ARTIFICIAL TEARS OPHTHALMIC OINT
TOPICAL_OINTMENT | OPHTHALMIC | Status: AC
  Filled 2018-01-13: qty 3.5

## 2018-01-13 MED ORDER — FENTANYL CITRATE (PF) 100 MCG/2ML IJ SOLN
50.0000 ug | Freq: Once | INTRAMUSCULAR | Status: DC
  Filled 2018-01-13: qty 2

## 2018-01-13 MED ORDER — PROPOFOL 1000 MG/100ML IV EMUL
0.0000 ug/kg/min | INTRAVENOUS | Status: DC
  Administered 2018-01-14: 40 ug/kg/min via INTRAVENOUS
  Administered 2018-01-14 (×2): 45 ug/kg/min via INTRAVENOUS
  Administered 2018-01-14: 25 ug/kg/min via INTRAVENOUS
  Administered 2018-01-14: 50 ug/kg/min via INTRAVENOUS
  Administered 2018-01-14 – 2018-01-15 (×2): 45 ug/kg/min via INTRAVENOUS
  Administered 2018-01-15: 40 ug/kg/min via INTRAVENOUS
  Administered 2018-01-15: 35 ug/kg/min via INTRAVENOUS
  Administered 2018-01-15: 16:00:00 via INTRAVENOUS
  Administered 2018-01-15 – 2018-01-16 (×5): 40 ug/kg/min via INTRAVENOUS
  Administered 2018-01-16: 45 ug/kg/min via INTRAVENOUS
  Administered 2018-01-16: 40 ug/kg/min via INTRAVENOUS
  Administered 2018-01-16: 50 ug/kg/min via INTRAVENOUS
  Administered 2018-01-17: 40 ug/kg/min via INTRAVENOUS
  Administered 2018-01-17: 45 ug/kg/min via INTRAVENOUS
  Administered 2018-01-17 (×2): 50 ug/kg/min via INTRAVENOUS
  Administered 2018-01-17 (×2): 45 ug/kg/min via INTRAVENOUS
  Administered 2018-01-18 – 2018-01-19 (×8): 50 ug/kg/min via INTRAVENOUS
  Administered 2018-01-19: 40 ug/kg/min via INTRAVENOUS
  Administered 2018-01-19 – 2018-01-21 (×13): 50 ug/kg/min via INTRAVENOUS
  Filled 2018-01-13 (×6): qty 100
  Filled 2018-01-13: qty 200
  Filled 2018-01-13 (×2): qty 100
  Filled 2018-01-13: qty 200
  Filled 2018-01-13 (×6): qty 100
  Filled 2018-01-13: qty 200
  Filled 2018-01-13 (×16): qty 100
  Filled 2018-01-13 (×2): qty 200
  Filled 2018-01-13 (×6): qty 100

## 2018-01-13 MED ORDER — TETANUS-DIPHTH-ACELL PERTUSSIS 5-2.5-18.5 LF-MCG/0.5 IM SUSP
0.5000 mL | Freq: Once | INTRAMUSCULAR | Status: AC
  Administered 2018-01-13: 0.5 mL via INTRAMUSCULAR

## 2018-01-13 MED ORDER — SODIUM CHLORIDE 0.9 % IV SOLN
INTRAVENOUS | Status: DC | PRN
  Administered 2018-01-13: 50 ug/min via INTRAVENOUS

## 2018-01-13 MED ORDER — LIDOCAINE 2% (20 MG/ML) 5 ML SYRINGE
INTRAMUSCULAR | Status: AC
  Filled 2018-01-13: qty 5

## 2018-01-13 MED ORDER — LACTATED RINGERS IV SOLN
INTRAVENOUS | Status: DC | PRN
  Administered 2018-01-13 (×2): via INTRAVENOUS

## 2018-01-13 MED ORDER — FENTANYL CITRATE (PF) 100 MCG/2ML IJ SOLN: 50.0000 ug | Freq: Once | INTRAMUSCULAR | Status: DC

## 2018-01-13 MED ORDER — SODIUM CHLORIDE 0.9 % IV BOLUS
2000.0000 mL | Freq: Once | INTRAVENOUS | Status: AC
  Administered 2018-01-13: 2000 mL via INTRAVENOUS

## 2018-01-13 MED ORDER — ONDANSETRON HCL 4 MG/2ML IJ SOLN
INTRAMUSCULAR | Status: DC | PRN
  Administered 2018-01-13: 4 mg via INTRAVENOUS

## 2018-01-13 MED ORDER — IOPAMIDOL (ISOVUE-300) INJECTION 61%
100.0000 mL | Freq: Once | INTRAVENOUS | Status: AC | PRN
  Administered 2018-01-13: 100 mL via INTRAVENOUS

## 2018-01-13 MED ORDER — ROCURONIUM BROMIDE 50 MG/5ML IV SOSY
PREFILLED_SYRINGE | INTRAVENOUS | Status: DC | PRN
  Administered 2018-01-13: 20 mg via INTRAVENOUS
  Administered 2018-01-13: 50 mg via INTRAVENOUS
  Administered 2018-01-13: 10 mg via INTRAVENOUS

## 2018-01-13 MED ORDER — CLINDAMYCIN PHOSPHATE 900 MG/50ML IV SOLN
INTRAVENOUS | Status: DC | PRN
  Administered 2018-01-13: 900 mg via INTRAVENOUS

## 2018-01-13 SURGICAL SUPPLY — 59 items
BANDAGE ELASTIC 4 VELCRO ST LF (GAUZE/BANDAGES/DRESSINGS) ×16 IMPLANT
BANDAGE ELASTIC 6 VELCRO ST LF (GAUZE/BANDAGES/DRESSINGS) ×8 IMPLANT
BAR GLASS FIBER EXFX 11X400 (EXFIX) ×4 IMPLANT
BAR GLASS FIBER EXFX 11X600 (EXFIX) ×8 IMPLANT
BLADE SURG 10 STRL SS (BLADE) ×4 IMPLANT
BNDG GAUZE ELAST 4 BULKY (GAUZE/BANDAGES/DRESSINGS) ×16 IMPLANT
CLAMP BLUE BAR TO PIN (EXFIX) ×8 IMPLANT
CLAMP PIN 2 BAR 45MM EXFIX (EXFIX) ×4 IMPLANT
COVER SURGICAL LIGHT HANDLE (MISCELLANEOUS) ×4 IMPLANT
COVER WAND RF STERILE (DRAPES) ×4 IMPLANT
DRAPE C-ARM 42X72 X-RAY (DRAPES) ×4 IMPLANT
DRAPE C-ARMOR (DRAPES) ×4 IMPLANT
DRAPE EXTREMITY BILATERAL (DRAPES) ×4 IMPLANT
DRAPE ORTHO SPLIT 77X108 STRL (DRAPES) ×4
DRAPE SURG ORHT 6 SPLT 77X108 (DRAPES) ×4 IMPLANT
DRAPE U-SHAPE 47X51 STRL (DRAPES) ×8 IMPLANT
ELECT REM PT RETURN 9FT ADLT (ELECTROSURGICAL) ×4
ELECTRODE REM PT RTRN 9FT ADLT (ELECTROSURGICAL) ×2 IMPLANT
GAUZE SPONGE 4X4 12PLY STRL (GAUZE/BANDAGES/DRESSINGS) ×4 IMPLANT
GAUZE XEROFORM 5X9 LF (GAUZE/BANDAGES/DRESSINGS) ×4 IMPLANT
GLOVE BIO SURGEON STRL SZ8 (GLOVE) ×12 IMPLANT
GLOVE BIOGEL PI IND STRL 7.0 (GLOVE) ×2 IMPLANT
GLOVE BIOGEL PI IND STRL 8 (GLOVE) ×8 IMPLANT
GLOVE BIOGEL PI INDICATOR 7.0 (GLOVE) ×2
GLOVE BIOGEL PI INDICATOR 8 (GLOVE) ×8
GLOVE ORTHO TXT STRL SZ7.5 (GLOVE) ×8 IMPLANT
GLOVE SURG SS PI 7.0 STRL IVOR (GLOVE) ×8 IMPLANT
GOWN STRL REUS W/ TWL LRG LVL3 (GOWN DISPOSABLE) ×2 IMPLANT
GOWN STRL REUS W/ TWL XL LVL3 (GOWN DISPOSABLE) ×4 IMPLANT
GOWN STRL REUS W/TWL LRG LVL3 (GOWN DISPOSABLE) ×2
GOWN STRL REUS W/TWL XL LVL3 (GOWN DISPOSABLE) ×4
HALF PIN 5.0X160 (EXFIX) ×8 IMPLANT
HANDPIECE INTERPULSE COAX TIP (DISPOSABLE) ×2
KIT BASIN OR (CUSTOM PROCEDURE TRAY) ×4 IMPLANT
KIT TURNOVER KIT B (KITS) ×4 IMPLANT
MANIFOLD NEPTUNE II (INSTRUMENTS) ×4 IMPLANT
NS IRRIG 1000ML POUR BTL (IV SOLUTION) ×4 IMPLANT
PACK ORTHO EXTREMITY (CUSTOM PROCEDURE TRAY) ×4 IMPLANT
PAD ABD 8X10 STRL (GAUZE/BANDAGES/DRESSINGS) ×24 IMPLANT
PAD ARMBOARD 7.5X6 YLW CONV (MISCELLANEOUS) ×8 IMPLANT
PAD CAST 4YDX4 CTTN HI CHSV (CAST SUPPLIES) ×4 IMPLANT
PADDING CAST COTTON 4X4 STRL (CAST SUPPLIES) ×4
PADDING CAST COTTON 6X4 STRL (CAST SUPPLIES) ×4 IMPLANT
PIN CLAMP 2BAR 75MM BLUE (EXFIX) ×8 IMPLANT
PIN HALF ORANGE 5X200X45MM (EXFIX) ×8 IMPLANT
PIN HALF YELLOW 5X160X35 (EXFIX) ×8 IMPLANT
PIN TRANSFIXING 5.0 (EXFIX) ×4 IMPLANT
SET HNDPC FAN SPRY TIP SCT (DISPOSABLE) ×2 IMPLANT
SPONGE LAP 18X18 X RAY DECT (DISPOSABLE) ×4 IMPLANT
STOCKINETTE IMPERVIOUS 9X36 MD (GAUZE/BANDAGES/DRESSINGS) ×4 IMPLANT
SUT ETHILON 2 0 FS 18 (SUTURE) ×20 IMPLANT
SUT VIC AB 2-0 CT1 27 (SUTURE)
SUT VIC AB 2-0 CT1 TAPERPNT 27 (SUTURE) IMPLANT
TOWEL OR 17X26 10 PK STRL BLUE (TOWEL DISPOSABLE) ×4 IMPLANT
TRAY FOLEY MTR SLVR 16FR STAT (SET/KITS/TRAYS/PACK) ×4 IMPLANT
TUBE CONNECTING 12'X1/4 (SUCTIONS) ×1
TUBE CONNECTING 12X1/4 (SUCTIONS) ×3 IMPLANT
UNDERPAD 30X30 (UNDERPADS AND DIAPERS) ×8 IMPLANT
YANKAUER SUCT BULB TIP NO VENT (SUCTIONS) ×4 IMPLANT

## 2018-01-13 NOTE — ED Notes (Signed)
Taken to short stay.  Report given to OR nurse.  Patient now alert and answering questions.  Was able to give name and birth date.  Records updated.
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
## 2018-01-13 NOTE — Brief Op Note (Signed)
01/13/2018  11:16 PM  PATIENT:  Wesley Sandoval  32 y.o. male  PRE-OPERATIVE DIAGNOSIS:  BILATERAL LOWER EXTREMITIES FRACTURE  POST-OPERATIVE DIAGNOSIS:  BILATERAL LOWER EXTREMITIES FRACTURE  PROCEDURE:  Procedure(s): EXTERNAL FIXATION LEG (Left) IRRIGATION AND DEBRIDEMENT EXTREMITY (Right) EXTERNAL FIXATION RIGHT LEG SPANNING THE ANKLE EXTERNAL FIXATION LEFT LOWER EXTREMITY SPANNING THE KNEE SURGEON:  Surgeon(s) and Role:    * Kathryne Hitch, MD - Primary  PHYSICIAN ASSISTANT: Rexene Edison, PA-C  ANESTHESIA:   general  COUNTS:  YES  DICTATION: .Other Dictation: Dictation Number [id number]  PLAN OF CARE: Admit to inpatient   PATIENT DISPOSITION:  To ICU intubated   Delay start of Pharmacological VTE agent (>24hrs) due to surgical blood loss or risk of bleeding: no
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
## 2018-01-13 NOTE — Anesthesia Preprocedure Evaluation (Addendum)
Anesthesia Evaluation  Patient identified by MRN, date of birth, ID band Patient awake    Reviewed: Allergy & Precautions, NPO status , Patient's Chart, lab work & pertinent test resultsPreop documentation limited or incomplete due to emergent nature of procedure.  Airway Mallampati: III   Neck ROM: Limited   Comment: Cervical collar Dental  (+) Teeth Intact, Dental Advisory Given Grill removed and placed in denture cup with patient's name on it.  With chart.:   Pulmonary    breath sounds clear to auscultation (-) decreased breath sounds      Cardiovascular  Rhythm:Regular Rate:Normal     Neuro/Psych    GI/Hepatic   Endo/Other    Renal/GU      Musculoskeletal   Abdominal Normal abdominal exam  (+)   Peds  Hematology   Anesthesia Other Findings   Reproductive/Obstetrics                           Anesthesia Physical Anesthesia Plan  ASA: III and emergent  Anesthesia Plan: General   Post-op Pain Management:    Induction: Intravenous  PONV Risk Score and Plan: 2 and Treatment may vary due to age or medical condition  Airway Management Planned: Oral ETT and Video Laryngoscope Planned  Additional Equipment:   Intra-op Plan:   Post-operative Plan: Possible Post-op intubation/ventilation  Informed Consent: I have reviewed the patients History and Physical, chart, labs and discussed the procedure including the risks, benefits and alternatives for the proposed anesthesia with the patient or authorized representative who has indicated his/her understanding and acceptance.   Dental advisory given  Plan Discussed with: CRNA  Anesthesia Plan Comments:         Anesthesia Quick Evaluation
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
## 2018-01-13 NOTE — Transfer of Care (Signed)
Immediate Anesthesia Transfer of Care Note  Patient: Wesley Sandoval  Procedure(s) Performed: EXTERNAL FIXATION LEG (Left ) IRRIGATION AND DEBRIDEMENT EXTREMITY (Right )  Patient Location: ICU  Anesthesia Type:General  Level of Consciousness: sedated and Patient remains intubated per anesthesia plan  Airway & Oxygen Therapy: Patient remains intubated per anesthesia plan and Patient placed on Ventilator (see vital sign flow sheet for setting)  Post-op Assessment: Report given to RN and Post -op Vital signs reviewed and stable  Post vital signs: Reviewed and stable  Last Vitals:  Vitals Value Taken Time  BP 107/64 01/13/2018 11:45 PM  Temp    Pulse 117 01/13/2018 11:47 PM  Resp 16 01/13/2018 11:47 PM  SpO2 100 % 01/13/2018 11:47 PM  Vitals shown include unvalidated device data.  Last Pain:  Vitals:   01/13/18 2055  TempSrc:   PainSc: 8          Complications: No apparent anesthesia complications

## 2018-01-13 NOTE — Anesthesia Procedure Notes (Addendum)
Procedure Name: Intubation Date/Time: 01/13/2018 9:11 PM Performed by: Edmonia Caprio, CRNA Pre-anesthesia Checklist: Patient identified, Suction available, Emergency Drugs available, Patient being monitored and Timeout performed Patient Re-evaluated:Patient Re-evaluated prior to induction Oxygen Delivery Method: Circle system utilized Preoxygenation: Pre-oxygenation with 100% oxygen Induction Type: IV induction, Rapid sequence and Cricoid Pressure applied Laryngoscope Size: 2, Glidescope and 3 Grade View: Grade I Tube type: Oral Tube size: 7.5 mm Number of attempts: 1 Airway Equipment and Method: Stylet and Video-laryngoscopy Placement Confirmation: ETT inserted through vocal cords under direct vision,  positive ETCO2 and breath sounds checked- equal and bilateral Secured at: 24 cm Tube secured with: Tape Dental Injury: Teeth and Oropharynx as per pre-operative assessment  Comments: Cervical collar remained on and neck neutral for induction and intubation.  Collar loosened slightly for DL, immediately re-secured after ETT placed.

## 2018-01-13 NOTE — Progress Notes (Signed)
   01/13/18 2200  Clinical Encounter Type  Visited With Patient;Health care provider  Visit Type Initial;Trauma  Consult/Referral To Chaplain   Ck'd w/ EMS then w/ staff members intermittently to see if had name for pt or someone to contact.  Nothing available. Spoke briefly w/ pt before signing off, let staff know I had briefed in-coming chaplain.

## 2018-01-14 ENCOUNTER — Inpatient Hospital Stay (HOSPITAL_COMMUNITY): Payer: BLUE CROSS/BLUE SHIELD

## 2018-01-14 LAB — BASIC METABOLIC PANEL
Anion gap: 15 (ref 5–15)
BUN: 17 mg/dL (ref 6–20)
CHLORIDE: 109 mmol/L (ref 98–111)
CO2: 17 mmol/L — ABNORMAL LOW (ref 22–32)
Calcium: 7.4 mg/dL — ABNORMAL LOW (ref 8.9–10.3)
Creatinine, Ser: 1.3 mg/dL — ABNORMAL HIGH (ref 0.61–1.24)
GFR calc Af Amer: >60 mL/min (ref >60)
Glucose, Bld: 140 mg/dL — ABNORMAL HIGH (ref 70–99)
POTASSIUM: 4.5 mmol/L (ref 3.5–5.1)
SODIUM: 141 mmol/L (ref 135–145)

## 2018-01-14 LAB — MRSA PCR SCREENING
MRSA BY PCR: NEGATIVE
MRSA by PCR: NEGATIVE

## 2018-01-14 LAB — CBC
HCT: 39.7 % (ref 39.0–52.0)
HEMATOCRIT: 40.3 % (ref 39.0–52.0)
HEMOGLOBIN: 12.5 g/dL — AB (ref 13.0–17.0)
HEMOGLOBIN: 12.4 g/dL — AB (ref 13.0–17.0)
MCH: 29.4 pg (ref 26.0–34.0)
MCH: 29.2 pg (ref 26.0–34.0)
MCHC: 31.5 g/dL (ref 30.0–36.0)
MCHC: 30.8 g/dL (ref 30.0–36.0)
MCV: 93.4 fL (ref 80.0–100.0)
MCV: 95 fL (ref 80.0–100.0)
NRBC: 0 % (ref 0.0–0.2)
NRBC: 0 % (ref 0.0–0.2)
PLATELETS: 129 10*3/uL — AB (ref 150–400)
Platelets: 155 10*3/uL (ref 150–400)
RBC: 4.25 MIL/uL (ref 4.22–5.81)
RBC: 4.24 MIL/uL (ref 4.22–5.81)
RDW: 13.2 % (ref 11.5–15.5)
RDW: 13.2 % (ref 11.5–15.5)
WBC: 30.1 10*3/uL — ABNORMAL HIGH (ref 4.0–10.5)
WBC: 34.2 10*3/uL — ABNORMAL HIGH (ref 4.0–10.5)

## 2018-01-14 LAB — PREPARE FRESH FROZEN PLASMA
UNIT DIVISION: 0
UNIT DIVISION: 0
Unit division: 0
Unit division: 0

## 2018-01-14 LAB — BLOOD GAS, ARTERIAL
Acid-base deficit: 1.7 mmol/L (ref 0.0–2.0)
BICARBONATE: 22.3 mmol/L (ref 20.0–28.0)
Drawn by: 50222
FIO2: 0.5
MECHVT: 630 mL
O2 SAT: 99.2 %
PATIENT TEMPERATURE: 98.6
PEEP: 10 cmH2O
RATE: 18 resp/min
pCO2 arterial: 36.3 mmHg (ref 32.0–48.0)
pH, Arterial: 7.405 (ref 7.350–7.450)
pO2, Arterial: 172 mmHg — ABNORMAL HIGH (ref 83.0–108.0)

## 2018-01-14 LAB — BPAM FFP
BLOOD PRODUCT EXPIRATION DATE: 201910312359
BLOOD PRODUCT EXPIRATION DATE: 201910312359
Blood Product Expiration Date: 201910312359
Blood Product Expiration Date: 201910312359
ISSUE DATE / TIME: 201910261901
ISSUE DATE / TIME: 201910270234
ISSUE DATE / TIME: 201910261901
ISSUE DATE / TIME: 201910270229
UNIT TYPE AND RH: 6200
UNIT TYPE AND RH: 6200
UNIT TYPE AND RH: 6200
Unit Type and Rh: 6200

## 2018-01-14 LAB — POCT I-STAT 3, ART BLOOD GAS (G3+)
Acid-base deficit: 4 mmol/L — ABNORMAL HIGH (ref 0.0–2.0)
Bicarbonate: 22.6 mmol/L (ref 20.0–28.0)
O2 Saturation: 100 %
PCO2 ART: 45.2 mmHg (ref 32.0–48.0)
Patient temperature: 98.9
TCO2: 24 mmol/L (ref 22–32)
pH, Arterial: 7.308 — ABNORMAL LOW (ref 7.350–7.450)
pO2, Arterial: 408 mmHg — ABNORMAL HIGH (ref 83.0–108.0)

## 2018-01-14 LAB — ABO/RH: ABO/RH(D): AB POS

## 2018-01-14 LAB — CDS SEROLOGY

## 2018-01-14 LAB — BLOOD PRODUCT ORDER (VERBAL) VERIFICATION

## 2018-01-14 LAB — HIV ANTIBODY (ROUTINE TESTING W REFLEX): HIV SCREEN 4TH GENERATION: NONREACTIVE

## 2018-01-14 LAB — TRIGLYCERIDES: Triglycerides: 313 mg/dL — ABNORMAL HIGH (ref <150)

## 2018-01-14 MED ORDER — METOPROLOL TARTRATE 5 MG/5ML IV SOLN
5.0000 mg | Freq: Four times a day (QID) | INTRAVENOUS | Status: DC | PRN
  Administered 2018-01-14 – 2018-01-17 (×3): 5 mg via INTRAVENOUS
  Filled 2018-01-14 (×3): qty 5

## 2018-01-14 MED ORDER — ALBUMIN HUMAN 5 % IV SOLN
INTRAVENOUS | Status: AC
  Filled 2018-01-14: qty 250

## 2018-01-14 MED ORDER — ENOXAPARIN SODIUM 40 MG/0.4ML ~~LOC~~ SOLN
40.0000 mg | SUBCUTANEOUS | Status: DC
  Administered 2018-01-14 – 2018-01-15 (×2): 40 mg via SUBCUTANEOUS
  Filled 2018-01-14 (×2): qty 0.4

## 2018-01-14 MED ORDER — KCL IN DEXTROSE-NACL 20-5-0.45 MEQ/L-%-% IV SOLN
INTRAVENOUS | Status: DC
  Administered 2018-01-14 – 2018-01-15 (×4): via INTRAVENOUS
  Filled 2018-01-14 (×4): qty 1000

## 2018-01-14 MED ORDER — SODIUM CHLORIDE 0.9 % IV SOLN
500.0000 mL | Freq: Once | INTRAVENOUS | Status: AC
  Administered 2018-01-14: 500 mL via INTRAVENOUS

## 2018-01-14 MED ORDER — FAMOTIDINE IN NACL 20-0.9 MG/50ML-% IV SOLN
20.0000 mg | Freq: Two times a day (BID) | INTRAVENOUS | Status: DC
  Administered 2018-01-14 – 2018-01-23 (×17): 20 mg via INTRAVENOUS
  Filled 2018-01-14 (×18): qty 50

## 2018-01-14 MED ORDER — ORAL CARE MOUTH RINSE
15.0000 mL | OROMUCOSAL | Status: DC
  Administered 2018-01-14 – 2018-01-21 (×62): 15 mL via OROMUCOSAL

## 2018-01-14 MED ORDER — CHLORHEXIDINE GLUCONATE 0.12% ORAL RINSE (MEDLINE KIT)
15.0000 mL | Freq: Two times a day (BID) | OROMUCOSAL | Status: DC
  Administered 2018-01-14 – 2018-01-22 (×16): 15 mL via OROMUCOSAL

## 2018-01-14 MED ORDER — ALBUMIN HUMAN 5 % IV SOLN
25.0000 g | Freq: Once | INTRAVENOUS | Status: AC
  Administered 2018-01-14: 25 g via INTRAVENOUS
  Filled 2018-01-14: qty 250
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
## 2018-01-14 NOTE — Anesthesia Postprocedure Evaluation (Signed)
Anesthesia Post Note  Patient: Wesley Sandoval  Procedure(s) Performed: EXTERNAL FIXATION LEG (Left ) IRRIGATION AND DEBRIDEMENT EXTREMITY (Right )     Patient location during evaluation: SICU Anesthesia Type: General Level of consciousness: sedated Pain management: pain level controlled Vital Signs Assessment: post-procedure vital signs reviewed and stable Respiratory status: patient remains intubated per anesthesia plan Cardiovascular status: stable Postop Assessment: no apparent nausea or vomiting Anesthetic complications: no    Last Vitals:  Vitals:   01/13/18 2045 01/13/18 2355  BP: 109/69   Pulse: (!) 101 (!) 120  Resp: 14 16  Temp:    SpO2: 100% 98%               Shelton Silvas

## 2018-01-15 ENCOUNTER — Inpatient Hospital Stay (HOSPITAL_COMMUNITY): Payer: BLUE CROSS/BLUE SHIELD

## 2018-01-15 ENCOUNTER — Inpatient Hospital Stay (HOSPITAL_COMMUNITY): Payer: BLUE CROSS/BLUE SHIELD | Admitting: Certified Registered Nurse Anesthetist

## 2018-01-15 ENCOUNTER — Encounter (HOSPITAL_COMMUNITY): Payer: Self-pay | Admitting: Orthopaedic Surgery

## 2018-01-15 ENCOUNTER — Encounter (HOSPITAL_COMMUNITY): Admission: EM | Disposition: A | Discharge status: Home Health | Payer: Self-pay | Source: Home / Self Care

## 2018-01-15 HISTORY — PX: EXTERNAL FIXATION LEG: SHX1549

## 2018-01-15 HISTORY — PX: I & D EXTREMITY: SHX5045

## 2018-01-15 LAB — COMPREHENSIVE METABOLIC PANEL
ALBUMIN: 2.3 g/dL — AB (ref 3.5–5.0)
ALT: 125 U/L — ABNORMAL HIGH (ref 0–44)
ALT: 136 U/L — ABNORMAL HIGH (ref 0–44)
ANION GAP: 3 — AB (ref 5–15)
AST: 140 U/L — AB (ref 15–41)
AST: 162 U/L — AB (ref 15–41)
Albumin: 2.6 g/dL — ABNORMAL LOW (ref 3.5–5.0)
Alkaline Phosphatase: 42 U/L (ref 38–126)
Alkaline Phosphatase: 51 U/L (ref 38–126)
Anion gap: 4 — ABNORMAL LOW (ref 5–15)
BILIRUBIN TOTAL: 0.6 mg/dL (ref 0.3–1.2)
BILIRUBIN TOTAL: 0.7 mg/dL (ref 0.3–1.2)
BUN: 10 mg/dL (ref 6–20)
BUN: 10 mg/dL (ref 6–20)
CALCIUM: 6.6 mg/dL — AB (ref 8.9–10.3)
CHLORIDE: 110 mmol/L (ref 98–111)
CO2: 22 mmol/L (ref 22–32)
CO2: 24 mmol/L (ref 22–32)
Calcium: 7.2 mg/dL — ABNORMAL LOW (ref 8.9–10.3)
Chloride: 107 mmol/L (ref 98–111)
Creatinine, Ser: 1.37 mg/dL — ABNORMAL HIGH (ref 0.61–1.24)
Creatinine, Ser: 1.18 mg/dL (ref 0.61–1.24)
GFR calc Af Amer: >60 mL/min (ref >60)
GFR calc Af Amer: >60 mL/min (ref >60)
GFR calc non Af Amer: >60 mL/min (ref >60)
GFR calc non Af Amer: >60 mL/min (ref >60)
Glucose, Bld: 524 mg/dL (ref 70–99)
Glucose, Bld: 132 mg/dL — ABNORMAL HIGH (ref 70–99)
POTASSIUM: 5.9 mmol/L — AB (ref 3.5–5.1)
POTASSIUM: 3.9 mmol/L (ref 3.5–5.1)
Sodium: 132 mmol/L — ABNORMAL LOW (ref 135–145)
Sodium: 138 mmol/L (ref 135–145)
TOTAL PROTEIN: 4.4 g/dL — AB (ref 6.5–8.1)
TOTAL PROTEIN: 4.9 g/dL — AB (ref 6.5–8.1)

## 2018-01-15 LAB — POCT I-STAT 4, (NA,K, GLUC, HGB,HCT)
Glucose, Bld: 137 mg/dL — ABNORMAL HIGH (ref 70–99)
HCT: 24 % — ABNORMAL LOW (ref 39.0–52.0)
Hemoglobin: 8.2 g/dL — ABNORMAL LOW (ref 13.0–17.0)
Potassium: 4.1 mmol/L (ref 3.5–5.1)
Sodium: 138 mmol/L (ref 135–145)

## 2018-01-15 LAB — CBC
HEMATOCRIT: 26.2 % — AB (ref 39.0–52.0)
Hemoglobin: 8.3 g/dL — ABNORMAL LOW (ref 13.0–17.0)
MCH: 29.6 pg (ref 26.0–34.0)
MCHC: 31.7 g/dL (ref 30.0–36.0)
MCV: 93.6 fL (ref 80.0–100.0)
PLATELETS: 95 10*3/uL — AB (ref 150–400)
RBC: 2.8 MIL/uL — AB (ref 4.22–5.81)
RDW: 13.2 % (ref 11.5–15.5)
WBC: 15 10*3/uL — ABNORMAL HIGH (ref 4.0–10.5)
nRBC: 0 % (ref 0.0–0.2)

## 2018-01-15 LAB — GLUCOSE, CAPILLARY
GLUCOSE-CAPILLARY: 104 mg/dL — AB (ref 70–99)
GLUCOSE-CAPILLARY: 110 mg/dL — AB (ref 70–99)
Glucose-Capillary: 119 mg/dL — ABNORMAL HIGH (ref 70–99)
Glucose-Capillary: 107 mg/dL — ABNORMAL HIGH (ref 70–99)

## 2018-01-15 LAB — POCT I-STAT 7, (LYTES, BLD GAS, ICA,H+H)
ACID-BASE DEFICIT: 2 mmol/L (ref 0.0–2.0)
ACID-BASE DEFICIT: 3 mmol/L — AB (ref 0.0–2.0)
ACID-BASE DEFICIT: 2 mmol/L (ref 0.0–2.0)
BICARBONATE: 24.3 mmol/L (ref 20.0–28.0)
Bicarbonate: 24.2 mmol/L (ref 20.0–28.0)
Bicarbonate: 23.2 mmol/L (ref 20.0–28.0)
CALCIUM ION: 1.1 mmol/L — AB (ref 1.15–1.40)
CALCIUM ION: 1.25 mmol/L (ref 1.15–1.40)
Calcium, Ion: 1.06 mmol/L — ABNORMAL LOW (ref 1.15–1.40)
HCT: 27 % — ABNORMAL LOW (ref 39.0–52.0)
HEMATOCRIT: 20 % — AB (ref 39.0–52.0)
HEMATOCRIT: 26 % — AB (ref 39.0–52.0)
HEMOGLOBIN: 8.8 g/dL — AB (ref 13.0–17.0)
Hemoglobin: 6.8 g/dL — CL (ref 13.0–17.0)
Hemoglobin: 9.2 g/dL — ABNORMAL LOW (ref 13.0–17.0)
O2 SAT: 94 %
O2 Saturation: 94 %
O2 Saturation: 96 %
PH ART: 7.297 — AB (ref 7.350–7.450)
PH ART: 7.312 — AB (ref 7.350–7.450)
PO2 ART: 89 mmHg (ref 83.0–108.0)
Patient temperature: 36
Patient temperature: 36.4
Potassium: 3.7 mmol/L (ref 3.5–5.1)
Potassium: 4.1 mmol/L (ref 3.5–5.1)
Potassium: 4.5 mmol/L (ref 3.5–5.1)
SODIUM: 141 mmol/L (ref 135–145)
SODIUM: 140 mmol/L (ref 135–145)
Sodium: 139 mmol/L (ref 135–145)
TCO2: 26 mmol/L (ref 22–32)
TCO2: 25 mmol/L (ref 22–32)
TCO2: 26 mmol/L (ref 22–32)
pCO2 arterial: 45.3 mmHg (ref 32.0–48.0)
pCO2 arterial: 47.3 mmHg (ref 32.0–48.0)
pCO2 arterial: 47.8 mmHg (ref 32.0–48.0)
pH, Arterial: 7.331 — ABNORMAL LOW (ref 7.350–7.450)
pO2, Arterial: 73 mmHg — ABNORMAL LOW (ref 83.0–108.0)
pO2, Arterial: 78 mmHg — ABNORMAL LOW (ref 83.0–108.0)

## 2018-01-15 LAB — LACTIC ACID, PLASMA: LACTIC ACID, VENOUS: 2.2 mmol/L — AB (ref 0.5–1.9)

## 2018-01-15 LAB — PREPARE RBC (CROSSMATCH)

## 2018-01-15 SURGERY — IRRIGATION AND DEBRIDEMENT EXTREMITY
Anesthesia: General | Laterality: Bilateral

## 2018-01-15 MED ORDER — FENTANYL CITRATE (PF) 250 MCG/5ML IJ SOLN
INTRAMUSCULAR | Status: DC | PRN
  Administered 2018-01-15 (×2): 100 ug via INTRAVENOUS
  Administered 2018-01-15: 75 ug via INTRAVENOUS
  Administered 2018-01-15 (×2): 50 ug via INTRAVENOUS
  Administered 2018-01-15: 25 ug via INTRAVENOUS
  Administered 2018-01-15 (×2): 100 ug via INTRAVENOUS
  Administered 2018-01-15: 50 ug via INTRAVENOUS

## 2018-01-15 MED ORDER — ALBUMIN HUMAN 5 % IV SOLN
12.5000 g | Freq: Once | INTRAVENOUS | Status: AC
  Administered 2018-01-15: 12.5 g via INTRAVENOUS
  Filled 2018-01-15: qty 250

## 2018-01-15 MED ORDER — VANCOMYCIN HCL 1000 MG IV SOLR
INTRAVENOUS | Status: DC | PRN
  Administered 2018-01-15 (×6): 1000 mg

## 2018-01-15 MED ORDER — 0.9 % SODIUM CHLORIDE (POUR BTL) OPTIME
TOPICAL | Status: DC | PRN
  Administered 2018-01-15: 1000 mL

## 2018-01-15 MED ORDER — FENTANYL CITRATE (PF) 250 MCG/5ML IJ SOLN
INTRAMUSCULAR | Status: AC
  Filled 2018-01-15: qty 5

## 2018-01-15 MED ORDER — INSULIN ASPART 100 UNIT/ML ~~LOC~~ SOLN
0.0000 [IU] | SUBCUTANEOUS | Status: DC
  Administered 2018-01-16 – 2018-01-21 (×11): 2 [IU] via SUBCUTANEOUS

## 2018-01-15 MED ORDER — SODIUM CHLORIDE 0.9 % IV SOLN
2.0000 g | INTRAVENOUS | Status: AC
  Administered 2018-01-15: 2 g via INTRAVENOUS
  Filled 2018-01-15: qty 20

## 2018-01-15 MED ORDER — ROCURONIUM BROMIDE 50 MG/5ML IV SOSY
PREFILLED_SYRINGE | INTRAVENOUS | Status: AC
  Filled 2018-01-15: qty 15

## 2018-01-15 MED ORDER — MIDAZOLAM HCL 2 MG/2ML IJ SOLN
INTRAMUSCULAR | Status: AC
  Filled 2018-01-15: qty 2

## 2018-01-15 MED ORDER — TOBRAMYCIN SULFATE 1.2 G IJ SOLR
INTRAMUSCULAR | Status: DC | PRN
  Administered 2018-01-15 (×3): 1.2 g

## 2018-01-15 MED ORDER — VANCOMYCIN HCL 1000 MG IV SOLR
INTRAVENOUS | Status: AC
  Filled 2018-01-15: qty 5000

## 2018-01-15 MED ORDER — PROPOFOL 1000 MG/100ML IV EMUL
INTRAVENOUS | Status: AC
  Filled 2018-01-15: qty 100

## 2018-01-15 MED ORDER — CALCIUM CHLORIDE 10 % IV SOLN
INTRAVENOUS | Status: DC | PRN
  Administered 2018-01-15 (×5): 200 mg via INTRAVENOUS

## 2018-01-15 MED ORDER — SODIUM CHLORIDE 0.9 % IV SOLN
INTRAVENOUS | Status: DC
  Administered 2018-01-15 – 2018-01-19 (×7): via INTRAVENOUS
  Administered 2018-01-20: 1000 mL via INTRAVENOUS
  Administered 2018-01-20: 22:00:00 via INTRAVENOUS

## 2018-01-15 MED ORDER — ROCURONIUM BROMIDE 10 MG/ML (PF) SYRINGE
PREFILLED_SYRINGE | INTRAVENOUS | Status: DC | PRN
  Administered 2018-01-15: 100 mg via INTRAVENOUS
  Administered 2018-01-15: 50 mg via INTRAVENOUS
  Administered 2018-01-15: 100 mg via INTRAVENOUS

## 2018-01-15 MED ORDER — PROPOFOL 10 MG/ML IV BOLUS
INTRAVENOUS | Status: AC
  Filled 2018-01-15: qty 20

## 2018-01-15 MED ORDER — SODIUM CHLORIDE 0.9 % IV SOLN
2.0000 g | INTRAVENOUS | Status: AC
  Administered 2018-01-16 – 2018-01-18 (×3): 2 g via INTRAVENOUS
  Filled 2018-01-15 (×3): qty 20

## 2018-01-15 MED ORDER — LACTATED RINGERS IV SOLN: INTRAVENOUS | Status: DC | PRN

## 2018-01-15 MED ORDER — LACTATED RINGERS IV SOLN
INTRAVENOUS | Status: DC | PRN
  Administered 2018-01-15: 15:00:00 via INTRAVENOUS

## 2018-01-15 MED ORDER — PROPOFOL 10 MG/ML IV BOLUS
INTRAVENOUS | Status: DC | PRN
  Administered 2018-01-15: 20 mg via INTRAVENOUS

## 2018-01-15 MED ORDER — PHENYLEPHRINE 40 MCG/ML (10ML) SYRINGE FOR IV PUSH (FOR BLOOD PRESSURE SUPPORT)
PREFILLED_SYRINGE | INTRAVENOUS | Status: DC | PRN
  Administered 2018-01-15: 80 ug via INTRAVENOUS
  Administered 2018-01-15: 120 ug via INTRAVENOUS
  Administered 2018-01-15: 80 ug via INTRAVENOUS
  Administered 2018-01-15: 120 ug via INTRAVENOUS

## 2018-01-15 MED ORDER — SODIUM CHLORIDE 0.9 % IV SOLN
INTRAVENOUS | Status: DC | PRN
  Administered 2018-01-15: 250 mL via INTRAVENOUS
  Administered 2018-01-17: 12:00:00 via INTRAVENOUS

## 2018-01-15 MED ORDER — PHENYLEPHRINE 40 MCG/ML (10ML) SYRINGE FOR IV PUSH (FOR BLOOD PRESSURE SUPPORT)
PREFILLED_SYRINGE | INTRAVENOUS | Status: AC
  Filled 2018-01-15: qty 10

## 2018-01-15 MED ORDER — METOPROLOL TARTRATE 25 MG/10 ML ORAL SUSPENSION
25.0000 mg | Freq: Two times a day (BID) | ORAL | Status: DC
  Administered 2018-01-15 – 2018-01-21 (×12): 25 mg
  Filled 2018-01-15 (×12): qty 10

## 2018-01-15 MED ORDER — MIDAZOLAM HCL 2 MG/2ML IJ SOLN
INTRAMUSCULAR | Status: DC | PRN
  Administered 2018-01-15: 2 mg via INTRAVENOUS

## 2018-01-15 MED ORDER — SODIUM CHLORIDE 0.9 % IR SOLN
Status: DC | PRN
  Administered 2018-01-15 (×5): 3000 mL

## 2018-01-15 MED ORDER — SODIUM POLYSTYRENE SULFONATE 15 GM/60ML PO SUSP: 45.0000 g | Freq: Once | ORAL | Status: DC

## 2018-01-15 MED ORDER — SODIUM CHLORIDE 0.9 % IV SOLN
INTRAVENOUS | Status: DC | PRN
  Administered 2018-01-15: 17:00:00 via INTRAVENOUS

## 2018-01-15 MED ORDER — TOBRAMYCIN SULFATE 1.2 G IJ SOLR
INTRAMUSCULAR | Status: AC
  Filled 2018-01-15: qty 3.6

## 2018-01-15 MED ORDER — CEFAZOLIN SODIUM-DEXTROSE 2-4 GM/100ML-% IV SOLN: 2.0000 g | INTRAVENOUS | Status: DC

## 2018-01-15 MED ORDER — ALBUMIN HUMAN 5 % IV SOLN
INTRAVENOUS | Status: DC | PRN
  Administered 2018-01-15: 15:00:00 via INTRAVENOUS

## 2018-01-15 SURGICAL SUPPLY — 78 items
BANDAGE ACE 4X5 VEL STRL LF (GAUZE/BANDAGES/DRESSINGS) IMPLANT
BANDAGE ACE 6X5 VEL STRL LF (GAUZE/BANDAGES/DRESSINGS) IMPLANT
BANDAGE ELASTIC 4 VELCRO ST LF (GAUZE/BANDAGES/DRESSINGS) ×6 IMPLANT
BANDAGE ELASTIC 6 VELCRO ST LF (GAUZE/BANDAGES/DRESSINGS) ×6 IMPLANT
BAR GLASS FIBER EXFX 11X350 (EXFIX) ×12 IMPLANT
BIT DRILL CANN MED FLUTE 4.0 (BIT) ×1 IMPLANT
BNDG COHESIVE 4X5 TAN STRL (GAUZE/BANDAGES/DRESSINGS) IMPLANT
BNDG GAUZE ELAST 4 BULKY (GAUZE/BANDAGES/DRESSINGS) ×18 IMPLANT
BRUSH SCRUB SURG 4.25 DISP (MISCELLANEOUS) ×9 IMPLANT
CANISTER WOUNDNEG PRESSURE 500 (CANNISTER) ×3 IMPLANT
CEMENT HV SMART SET (Cement) ×6 IMPLANT
CHLORAPREP W/TINT 26ML (MISCELLANEOUS) IMPLANT
CLAMP BLUE BAR TO BAR (EXFIX) ×6 IMPLANT
CLOSURE WOUND 1/2 X4 (GAUZE/BANDAGES/DRESSINGS)
CONNECTOR Y ATS VAC SYSTEM (MISCELLANEOUS) ×6 IMPLANT
COVER MAYO STAND STRL (DRAPES) ×6 IMPLANT
COVER SURGICAL LIGHT HANDLE (MISCELLANEOUS) ×3 IMPLANT
COVER WAND RF STERILE (DRAPES) ×3 IMPLANT
DRAPE C-ARM 42X72 X-RAY (DRAPES) ×3 IMPLANT
DRAPE C-ARMOR (DRAPES) ×3 IMPLANT
DRAPE IMP U-DRAPE 54X76 (DRAPES) ×6 IMPLANT
DRAPE INCISE IOBAN 66X45 STRL (DRAPES) ×3 IMPLANT
DRAPE ORTHO SPLIT 77X108 STRL (DRAPES)
DRAPE SURG 17X23 STRL (DRAPES) ×3 IMPLANT
DRAPE SURG ORHT 6 SPLT 77X108 (DRAPES) IMPLANT
DRAPE U-SHAPE 47X51 STRL (DRAPES) ×6 IMPLANT
DRILL CANN 4.0MM (BIT) ×3
DRSG ADAPTIC 3X8 NADH LF (GAUZE/BANDAGES/DRESSINGS) IMPLANT
DRSG MEPITEL 4X7.2 (GAUZE/BANDAGES/DRESSINGS) ×3 IMPLANT
DRSG VAC ATS LRG SENSATRAC (GAUZE/BANDAGES/DRESSINGS) ×6 IMPLANT
ELECT REM PT RETURN 9FT ADLT (ELECTROSURGICAL) ×3
ELECTRODE REM PT RTRN 9FT ADLT (ELECTROSURGICAL) ×1 IMPLANT
EVACUATOR 1/8 PVC DRAIN (DRAIN) IMPLANT
GAUZE SPONGE 4X4 12PLY STRL (GAUZE/BANDAGES/DRESSINGS) IMPLANT
GLOVE BIO SURGEON STRL SZ 6.5 (GLOVE) ×16 IMPLANT
GLOVE BIO SURGEON STRL SZ7.5 (GLOVE) ×36 IMPLANT
GLOVE BIO SURGEONS STRL SZ 6.5 (GLOVE) ×8
GLOVE BIOGEL PI IND STRL 7.5 (GLOVE) ×1 IMPLANT
GLOVE BIOGEL PI INDICATOR 7.5 (GLOVE) ×2
GOWN STRL REUS W/ TWL LRG LVL3 (GOWN DISPOSABLE) ×3 IMPLANT
GOWN STRL REUS W/TWL LRG LVL3 (GOWN DISPOSABLE) ×6
HALF PIN 5.0X160 (EXFIX) ×3 IMPLANT
HANDPIECE INTERPULSE COAX TIP (DISPOSABLE) ×2
KIT BASIN OR (CUSTOM PROCEDURE TRAY) ×3 IMPLANT
KIT TURNOVER KIT B (KITS) ×3 IMPLANT
MANIFOLD NEPTUNE II (INSTRUMENTS) ×3 IMPLANT
NEEDLE 22X1 1/2 (OR ONLY) (NEEDLE) IMPLANT
NS IRRIG 1000ML POUR BTL (IV SOLUTION) ×6 IMPLANT
PACK ORTHO EXTREMITY (CUSTOM PROCEDURE TRAY) ×3 IMPLANT
PAD ARMBOARD 7.5X6 YLW CONV (MISCELLANEOUS) ×3 IMPLANT
PAD CAST 4YDX4 CTTN HI CHSV (CAST SUPPLIES) ×1 IMPLANT
PAD NEG PRESSURE SENSATRAC (MISCELLANEOUS) ×3 IMPLANT
PADDING CAST COTTON 4X4 STRL (CAST SUPPLIES) ×2
PADDING CAST COTTON 6X4 STRL (CAST SUPPLIES) ×3 IMPLANT
PADDING CAST SYN 6 (CAST SUPPLIES) ×2
PADDING CAST SYNTHETIC 6X4 NS (CAST SUPPLIES) ×1 IMPLANT
SET HNDPC FAN SPRY TIP SCT (DISPOSABLE) ×1 IMPLANT
SPONGE LAP 18X18 X RAY DECT (DISPOSABLE) IMPLANT
SPONGE LAP 4X18 RFD (DISPOSABLE) ×12 IMPLANT
STAPLER VISISTAT 35W (STAPLE) IMPLANT
STRIP CLOSURE SKIN 1/2X4 (GAUZE/BANDAGES/DRESSINGS) IMPLANT
SUT ETHILON 2 0 FS 18 (SUTURE) IMPLANT
SUT ETHILON 2 0 PSLX (SUTURE) ×18 IMPLANT
SUT ETHILON 3 0 PS 1 (SUTURE) IMPLANT
SUT MON AB 2-0 CT1 36 (SUTURE) ×3 IMPLANT
SUT PDS AB 0 CT 36 (SUTURE) ×6 IMPLANT
SUT PDS AB 1 CTX 36 (SUTURE) ×3 IMPLANT
SUT PROLENE 0 CT (SUTURE) IMPLANT
SWAB CULTURE ESWAB REG 1ML (MISCELLANEOUS) IMPLANT
TOWEL OR 17X24 6PK STRL BLUE (TOWEL DISPOSABLE) ×6 IMPLANT
TOWEL OR 17X26 10 PK STRL BLUE (TOWEL DISPOSABLE) ×6 IMPLANT
TOWER CARTRIDGE SMART MIX (DISPOSABLE) ×3 IMPLANT
TUBE CONNECTING 12'X1/4 (SUCTIONS) ×1
TUBE CONNECTING 12X1/4 (SUCTIONS) ×2 IMPLANT
UNDERPAD 30X30 (UNDERPADS AND DIAPERS) ×3 IMPLANT
WATER STERILE IRR 1000ML POUR (IV SOLUTION) IMPLANT
WND VAC CANISTER 500ML (MISCELLANEOUS) ×3 IMPLANT
YANKAUER SUCT BULB TIP NO VENT (SUCTIONS) ×3 IMPLANT

## 2018-01-15 NOTE — Anesthesia Procedure Notes (Signed)
Arterial Line Insertion Start/End10/28/2019 3:25 PM, 01/15/2018 3:32 PM Performed by: Jairo Ben, MD, anesthesiologist  Patient location: OR. Preanesthetic checklist: patient identified, IV checked, surgical consent, monitors and equipment checked, pre-op evaluation, timeout performed and anesthesia consent Left, radial was placed Catheter size: 20 G Hand hygiene performed  and maximum sterile barriers used   Attempts: 2 Procedure performed without using ultrasound guided technique. Following insertion, Biopatch and dressing applied. Patient tolerated the procedure well with no immediate complications.

## 2018-01-15 NOTE — Progress Notes (Signed)
CRITICAL VALUE ALERT  Critical Value:  Lactic Acid 2.2  Date & Time Notied:  01/15/18 1140  Provider Notified: Janee Morn  Orders Received/Actions taken: Albumin ordered

## 2018-01-15 NOTE — OR Nursing (Signed)
20 cement balls placed in left upper leg.
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
## 2018-01-15 NOTE — Anesthesia Postprocedure Evaluation (Signed)
Anesthesia Post Note  Patient: Wesley Sandoval  Procedure(s) Performed: IRRIGATION AND DEBRIDEMENT LEFT OPEN FEMUR FRACTURE AND BILATERAL OPEN TIBIA FRACTURES (Bilateral ) POSSIBLE ADJUSTMENT OF EXTERNAL FIXATOR (Bilateral )     Patient location during evaluation: SICU Anesthesia Type: General Level of consciousness: sedated Pain management: pain level controlled Vital Signs Assessment: post-procedure vital signs reviewed and stable Respiratory status: patient remains intubated per anesthesia plan Cardiovascular status: stable Postop Assessment: no apparent nausea or vomiting Anesthetic complications: no    Last Vitals:  Vitals:   01/15/18 1400 01/15/18 1500  BP: (!) 78/68 109/68  Pulse: (!) 118 (!) 114  Resp: (!) 21 18  Temp:    SpO2: 100% 100%    Last Pain:  Vitals:   01/15/18 1200  TempSrc: Axillary  PainSc:                  Wesley Sandoval
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
## 2018-01-15 NOTE — Transfer of Care (Signed)
Immediate Anesthesia Transfer of Care Note  Patient: Wesley Sandoval  Procedure(s) Performed: IRRIGATION AND DEBRIDEMENT LEFT OPEN FEMUR FRACTURE AND BILATERAL OPEN TIBIA FRACTURES (Bilateral ) POSSIBLE ADJUSTMENT OF EXTERNAL FIXATOR (Bilateral )  Patient Location: ICU  Anesthesia Type:General  Level of Consciousness: sedated  Airway & Oxygen Therapy: Patient remains intubated per anesthesia plan and Patient placed on Ventilator (see vital sign flow sheet for setting)  Post-op Assessment: Report given to RN and Post -op Vital signs reviewed and stable  Post vital signs: Reviewed and stable  Last Vitals:  Vitals Value Taken Time  BP    Temp    Pulse 109 01/15/2018  7:08 PM  Resp 18 01/15/2018  7:08 PM  SpO2 100 % 01/15/2018  7:08 PM  Vitals shown include unvalidated device data.  Last Pain:  Vitals:   01/15/18 1200  TempSrc: Axillary  PainSc:          Complications: No apparent anesthesia complications

## 2018-01-16 ENCOUNTER — Encounter (HOSPITAL_COMMUNITY): Payer: Self-pay | Admitting: Student

## 2018-01-16 ENCOUNTER — Inpatient Hospital Stay (HOSPITAL_COMMUNITY): Payer: BLUE CROSS/BLUE SHIELD

## 2018-01-16 ENCOUNTER — Encounter (HOSPITAL_COMMUNITY): Admission: EM | Disposition: A | Discharge status: Home Health | Payer: Self-pay | Source: Home / Self Care

## 2018-01-16 LAB — BASIC METABOLIC PANEL
ANION GAP: 4 — AB (ref 5–15)
BUN: 7 mg/dL (ref 6–20)
CALCIUM: 7.7 mg/dL — AB (ref 8.9–10.3)
CHLORIDE: 112 mmol/L — AB (ref 98–111)
CO2: 24 mmol/L (ref 22–32)
Creatinine, Ser: 1.03 mg/dL (ref 0.61–1.24)
GFR calc non Af Amer: >60 mL/min (ref >60)
Glucose, Bld: 141 mg/dL — ABNORMAL HIGH (ref 70–99)
Potassium: 3.8 mmol/L (ref 3.5–5.1)
SODIUM: 140 mmol/L (ref 135–145)

## 2018-01-16 LAB — GLUCOSE, CAPILLARY
GLUCOSE-CAPILLARY: 104 mg/dL — AB (ref 70–99)
GLUCOSE-CAPILLARY: 124 mg/dL — AB (ref 70–99)
GLUCOSE-CAPILLARY: 125 mg/dL — AB (ref 70–99)
Glucose-Capillary: 92 mg/dL (ref 70–99)
Glucose-Capillary: 112 mg/dL — ABNORMAL HIGH (ref 70–99)
Glucose-Capillary: 120 mg/dL — ABNORMAL HIGH (ref 70–99)

## 2018-01-16 LAB — PREPARE FRESH FROZEN PLASMA

## 2018-01-16 LAB — MAGNESIUM
MAGNESIUM: 1.9 mg/dL (ref 1.7–2.4)
Magnesium: 2 mg/dL (ref 1.7–2.4)

## 2018-01-16 LAB — BPAM FFP
Blood Product Expiration Date: 201910312359
ISSUE DATE / TIME: 201910281650
Unit Type and Rh: 8400

## 2018-01-16 LAB — TRIGLYCERIDES: Triglycerides: 397 mg/dL — ABNORMAL HIGH (ref <150)

## 2018-01-16 LAB — CBC
HEMATOCRIT: 29.4 % — AB (ref 39.0–52.0)
HEMOGLOBIN: 9.3 g/dL — AB (ref 13.0–17.0)
MCH: 28.6 pg (ref 26.0–34.0)
MCHC: 31.6 g/dL (ref 30.0–36.0)
MCV: 90.5 fL (ref 80.0–100.0)
NRBC: 0 % (ref 0.0–0.2)
Platelets: 81 10*3/uL — ABNORMAL LOW (ref 150–400)
RBC: 3.25 MIL/uL — ABNORMAL LOW (ref 4.22–5.81)
RDW: 13.6 % (ref 11.5–15.5)
WBC: 13.5 10*3/uL — ABNORMAL HIGH (ref 4.0–10.5)

## 2018-01-16 LAB — PHOSPHORUS
PHOSPHORUS: 1.7 mg/dL — AB (ref 2.5–4.6)
Phosphorus: 1.3 mg/dL — ABNORMAL LOW (ref 2.5–4.6)

## 2018-01-16 SURGERY — IRRIGATION AND DEBRIDEMENT EXTREMITY
Anesthesia: General | Laterality: Bilateral

## 2018-01-16 MED ORDER — ACETAMINOPHEN 160 MG/5ML PO SOLN
1000.0000 mg | Freq: Four times a day (QID) | ORAL | Status: DC
  Administered 2018-01-16 – 2018-01-21 (×18): 1000 mg
  Filled 2018-01-16 (×18): qty 40.6

## 2018-01-16 MED ORDER — QUETIAPINE FUMARATE 25 MG PO TABS
50.0000 mg | ORAL_TABLET | Freq: Two times a day (BID) | ORAL | Status: DC
  Administered 2018-01-16 – 2018-01-17 (×3): 50 mg
  Filled 2018-01-16 (×3): qty 2

## 2018-01-16 MED ORDER — CLONAZEPAM 0.5 MG PO TABS
0.5000 mg | ORAL_TABLET | Freq: Two times a day (BID) | ORAL | Status: DC
  Administered 2018-01-16 – 2018-01-17 (×3): 0.5 mg
  Filled 2018-01-16 (×3): qty 1

## 2018-01-16 MED ORDER — PIVOT 1.5 CAL PO LIQD
1000.0000 mL | ORAL | Status: DC
  Administered 2018-01-17 – 2018-01-19 (×3): 1000 mL

## 2018-01-16 MED ORDER — VITAL HIGH PROTEIN PO LIQD
1000.0000 mL | ORAL | Status: DC
  Administered 2018-01-16: 1000 mL

## 2018-01-16 MED ORDER — CLONAZEPAM 0.1 MG/ML ORAL SUSPENSION: 0.5000 mg | Freq: Two times a day (BID) | ORAL | Status: DC

## 2018-01-16 MED ORDER — PRO-STAT SUGAR FREE PO LIQD
60.0000 mL | Freq: Three times a day (TID) | ORAL | Status: DC
  Administered 2018-01-16 – 2018-01-21 (×13): 60 mL
  Filled 2018-01-16 (×13): qty 60

## 2018-01-16 MED ORDER — PRO-STAT SUGAR FREE PO LIQD
30.0000 mL | Freq: Two times a day (BID) | ORAL | Status: DC
  Administered 2018-01-16: 30 mL
  Filled 2018-01-16: qty 30

## 2018-01-16 NOTE — Procedures (Signed)
Chest Tube Insertion Procedure Note  Indications:  Clinically significant R hemopneumothorax  Pre-operative Diagnosis: R hemopneumothorax  Post-operative Diagnosis: R hemopneumothorax  Procedure Details  Informed consent was obtained for the procedure, including sedation.  Risks of lung perforation, hemorrhage, arrhythmia, and adverse drug reaction were discussed.   After sterile skin prep, using standard technique, a 14 French tube was placed in the right anterior axillary line above the nipple level using Seldinger technoque. The tube was sutured in place and connected to a Pleurevac. A sterile dressing was applied.  Findings: Rush of air and bloody fluid  Estimated Blood Loss:  140 initially         Specimens:  None              Complications:  None; patient tolerated the procedure well.         Disposition: ICU - intubated and critically ill.         Condition: stable  Violeta Gelinas, MD, MPH, FACS Trauma: [phone] General Surgery: [phone]

## 2018-01-16 NOTE — Progress Notes (Addendum)
ETT was advanced 3cm per Dr Janee Morn based off his chest xray. ETT is now at 28cm at the lip. This patient is 6'5 per his wife.
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
## 2018-01-17 ENCOUNTER — Encounter (HOSPITAL_COMMUNITY): Admission: EM | Disposition: A | Discharge status: Home Health | Payer: Self-pay | Source: Home / Self Care

## 2018-01-17 ENCOUNTER — Inpatient Hospital Stay (HOSPITAL_COMMUNITY): Payer: BLUE CROSS/BLUE SHIELD | Admitting: Registered Nurse

## 2018-01-17 ENCOUNTER — Encounter (HOSPITAL_COMMUNITY): Payer: Self-pay | Admitting: *Deleted

## 2018-01-17 ENCOUNTER — Inpatient Hospital Stay (HOSPITAL_COMMUNITY): Payer: BLUE CROSS/BLUE SHIELD

## 2018-01-17 HISTORY — PX: TIBIA IM NAIL INSERTION: SHX2516

## 2018-01-17 HISTORY — PX: I & D EXTREMITY: SHX5045

## 2018-01-17 LAB — TYPE AND SCREEN
ABO/RH(D): AB POS
ANTIBODY SCREEN: NEGATIVE
UNIT DIVISION: 0
UNIT DIVISION: 0
UNIT DIVISION: 0
UNIT DIVISION: 0
UNIT DIVISION: 0
UNIT DIVISION: 0
UNIT DIVISION: 0
UNIT DIVISION: 0
Unit division: 0
Unit division: 0
Unit division: 0
Unit division: 0
Unit division: 0
Unit division: 0

## 2018-01-17 LAB — BASIC METABOLIC PANEL
ANION GAP: 5 (ref 5–15)
BUN: 13 mg/dL (ref 6–20)
CALCIUM: 7.5 mg/dL — AB (ref 8.9–10.3)
CO2: 24 mmol/L (ref 22–32)
CREATININE: 0.92 mg/dL (ref 0.61–1.24)
Chloride: 113 mmol/L — ABNORMAL HIGH (ref 98–111)
Glucose, Bld: 112 mg/dL — ABNORMAL HIGH (ref 70–99)
Potassium: 3.5 mmol/L (ref 3.5–5.1)
Sodium: 142 mmol/L (ref 135–145)

## 2018-01-17 LAB — PHOSPHORUS
PHOSPHORUS: 1.9 mg/dL — AB (ref 2.5–4.6)
Phosphorus: 1.6 mg/dL — ABNORMAL LOW (ref 2.5–4.6)

## 2018-01-17 LAB — POCT I-STAT 7, (LYTES, BLD GAS, ICA,H+H)
ACID-BASE DEFICIT: 1 mmol/L (ref 0.0–2.0)
Acid-base deficit: 3 mmol/L — ABNORMAL HIGH (ref 0.0–2.0)
BICARBONATE: 22.3 mmol/L (ref 20.0–28.0)
Bicarbonate: 24 mmol/L (ref 20.0–28.0)
Calcium, Ion: 1.13 mmol/L — ABNORMAL LOW (ref 1.15–1.40)
Calcium, Ion: 1.07 mmol/L — ABNORMAL LOW (ref 1.15–1.40)
HCT: 22 % — ABNORMAL LOW (ref 39.0–52.0)
HCT: 25 % — ABNORMAL LOW (ref 39.0–52.0)
HEMOGLOBIN: 7.5 g/dL — AB (ref 13.0–17.0)
Hemoglobin: 8.5 g/dL — ABNORMAL LOW (ref 13.0–17.0)
O2 Saturation: 90 %
O2 Saturation: 96 %
PCO2 ART: 40.7 mmHg (ref 32.0–48.0)
Patient temperature: 36.9
Potassium: 3.8 mmol/L (ref 3.5–5.1)
Potassium: 4 mmol/L (ref 3.5–5.1)
SODIUM: 141 mmol/L (ref 135–145)
Sodium: 143 mmol/L (ref 135–145)
TCO2: 25 mmol/L (ref 22–32)
TCO2: 24 mmol/L (ref 22–32)
pCO2 arterial: 43 mmHg (ref 32.0–48.0)
pH, Arterial: 7.355 (ref 7.350–7.450)
pH, Arterial: 7.347 — ABNORMAL LOW (ref 7.350–7.450)
pO2, Arterial: 62 mmHg — ABNORMAL LOW (ref 83.0–108.0)
pO2, Arterial: 87 mmHg (ref 83.0–108.0)

## 2018-01-17 LAB — CBC
HEMATOCRIT: 25.4 % — AB (ref 39.0–52.0)
Hemoglobin: 8 g/dL — ABNORMAL LOW (ref 13.0–17.0)
MCH: 28.9 pg (ref 26.0–34.0)
MCHC: 31.5 g/dL (ref 30.0–36.0)
MCV: 91.7 fL (ref 80.0–100.0)
NRBC: 0 % (ref 0.0–0.2)
PLATELETS: 92 10*3/uL — AB (ref 150–400)
RBC: 2.77 MIL/uL — ABNORMAL LOW (ref 4.22–5.81)
RDW: 13.7 % (ref 11.5–15.5)
WBC: 11 10*3/uL — AB (ref 4.0–10.5)

## 2018-01-17 LAB — BPAM RBC
BLOOD PRODUCT EXPIRATION DATE: 201911132359
BLOOD PRODUCT EXPIRATION DATE: 201911262359
BLOOD PRODUCT EXPIRATION DATE: 201911282359
Blood Product Expiration Date: 201911092359
Blood Product Expiration Date: 201911112359
Blood Product Expiration Date: 201911122359
Blood Product Expiration Date: 201911272359
Blood Product Expiration Date: 201911272359
Blood Product Expiration Date: 201911272359
Blood Product Expiration Date: 201911272359
Blood Product Expiration Date: 201911272359
Blood Product Expiration Date: 201911272359
Blood Product Expiration Date: 201911282359
Blood Product Expiration Date: 201911302359
ISSUE DATE / TIME: 201910261901
ISSUE DATE / TIME: 201910281108
ISSUE DATE / TIME: 201910281502
ISSUE DATE / TIME: 201910281536
ISSUE DATE / TIME: 201910281536
ISSUE DATE / TIME: 201910300406
ISSUE DATE / TIME: 201910300826
ISSUE DATE / TIME: 201910301111
ISSUE DATE / TIME: 201910261901
ISSUE DATE / TIME: 201910270234
ISSUE DATE / TIME: 201910281502
ISSUE DATE / TIME: 201910300826
ISSUE DATE / TIME: 201910301237
UNIT TYPE AND RH: 6200
UNIT TYPE AND RH: 6200
UNIT TYPE AND RH: 8400
UNIT TYPE AND RH: 8400
Unit Type and Rh: 6200
Unit Type and Rh: 6200
Unit Type and Rh: 6200
Unit Type and Rh: 6200
Unit Type and Rh: 6200
Unit Type and Rh: 6200
Unit Type and Rh: 9500
Unit Type and Rh: 9500
Unit Type and Rh: 9500
Unit Type and Rh: 9500

## 2018-01-17 LAB — SURGICAL PCR SCREEN
MRSA, PCR: NEGATIVE
Staphylococcus aureus: NEGATIVE

## 2018-01-17 LAB — GLUCOSE, CAPILLARY
GLUCOSE-CAPILLARY: 84 mg/dL (ref 70–99)
GLUCOSE-CAPILLARY: 98 mg/dL (ref 70–99)
GLUCOSE-CAPILLARY: 77 mg/dL (ref 70–99)
GLUCOSE-CAPILLARY: 121 mg/dL — AB (ref 70–99)
GLUCOSE-CAPILLARY: 144 mg/dL — AB (ref 70–99)

## 2018-01-17 LAB — MAGNESIUM
MAGNESIUM: 2.1 mg/dL (ref 1.7–2.4)
Magnesium: 2.1 mg/dL (ref 1.7–2.4)

## 2018-01-17 LAB — POCT I-STAT 3, ART BLOOD GAS (G3+)
Acid-base deficit: 1 mmol/L (ref 0.0–2.0)
Bicarbonate: 23.8 mmol/L (ref 20.0–28.0)
O2 SAT: 97 %
PH ART: 7.389 (ref 7.350–7.450)
Patient temperature: 98.1
TCO2: 25 mmol/L (ref 22–32)
pCO2 arterial: 39.4 mmHg (ref 32.0–48.0)
pO2, Arterial: 86 mmHg (ref 83.0–108.0)

## 2018-01-17 LAB — PREPARE RBC (CROSSMATCH)

## 2018-01-17 LAB — TRIGLYCERIDES: TRIGLYCERIDES: 262 mg/dL — AB (ref <150)

## 2018-01-17 SURGERY — INSERTION, INTRAMEDULLARY ROD, TIBIA
Anesthesia: General | Site: Leg Upper | Laterality: Right

## 2018-01-17 MED ORDER — SODIUM CHLORIDE 0.9 % IR SOLN
Status: DC | PRN
  Administered 2018-01-17: 4000 mL

## 2018-01-17 MED ORDER — LACTATED RINGERS IV SOLN
INTRAVENOUS | Status: DC | PRN
  Administered 2018-01-17 (×2): via INTRAVENOUS

## 2018-01-17 MED ORDER — ROCURONIUM BROMIDE 50 MG/5ML IV SOSY
PREFILLED_SYRINGE | INTRAVENOUS | Status: AC
  Filled 2018-01-17: qty 10

## 2018-01-17 MED ORDER — VANCOMYCIN HCL IN DEXTROSE 1-5 GM/200ML-% IV SOLN
INTRAVENOUS | Status: AC
  Filled 2018-01-17: qty 400

## 2018-01-17 MED ORDER — BACITRACIN 500 UNIT/GM EX OINT
TOPICAL_OINTMENT | CUTANEOUS | Status: DC | PRN
  Administered 2018-01-17: 1 via TOPICAL

## 2018-01-17 MED ORDER — CEFAZOLIN SODIUM-DEXTROSE 2-4 GM/100ML-% IV SOLN
2.0000 g | INTRAVENOUS | Status: AC
  Administered 2018-01-17 (×2): 2 g via INTRAVENOUS

## 2018-01-17 MED ORDER — MIDAZOLAM HCL 2 MG/2ML IJ SOLN
INTRAMUSCULAR | Status: AC
  Filled 2018-01-17: qty 2

## 2018-01-17 MED ORDER — DOUBLE ANTIBIOTIC 500-10000 UNIT/GM EX OINT
TOPICAL_OINTMENT | CUTANEOUS | Status: AC
  Filled 2018-01-17: qty 1

## 2018-01-17 MED ORDER — PROPOFOL 10 MG/ML IV BOLUS
INTRAVENOUS | Status: AC
  Filled 2018-01-17: qty 20

## 2018-01-17 MED ORDER — PROPOFOL 1000 MG/100ML IV EMUL
INTRAVENOUS | Status: AC
  Filled 2018-01-17: qty 100

## 2018-01-17 MED ORDER — CEFAZOLIN SODIUM 1 G IJ SOLR
INTRAMUSCULAR | Status: AC
  Filled 2018-01-17: qty 20

## 2018-01-17 MED ORDER — SODIUM CHLORIDE 0.9% IV SOLUTION: Freq: Once | INTRAVENOUS | Status: AC

## 2018-01-17 MED ORDER — TOBRAMYCIN SULFATE 1.2 G IJ SOLR
INTRAMUSCULAR | Status: AC
  Filled 2018-01-17: qty 2.4

## 2018-01-17 MED ORDER — VANCOMYCIN HCL 1000 MG IV SOLR
INTRAVENOUS | Status: AC
  Filled 2018-01-17: qty 2000

## 2018-01-17 MED ORDER — MIDAZOLAM HCL 5 MG/5ML IJ SOLN
INTRAMUSCULAR | Status: DC | PRN
  Administered 2018-01-17: 2 mg via INTRAVENOUS

## 2018-01-17 MED ORDER — DEXAMETHASONE SODIUM PHOSPHATE 10 MG/ML IJ SOLN
INTRAMUSCULAR | Status: DC | PRN
  Administered 2018-01-17: 10 mg via INTRAVENOUS

## 2018-01-17 MED ORDER — FENTANYL CITRATE (PF) 250 MCG/5ML IJ SOLN
INTRAMUSCULAR | Status: AC
  Filled 2018-01-17: qty 5

## 2018-01-17 MED ORDER — ROCURONIUM BROMIDE 50 MG/5ML IV SOSY
PREFILLED_SYRINGE | INTRAVENOUS | Status: DC | PRN
  Administered 2018-01-17 (×4): 50 mg via INTRAVENOUS

## 2018-01-17 MED ORDER — ONDANSETRON HCL 4 MG/2ML IJ SOLN
INTRAMUSCULAR | Status: DC | PRN
  Administered 2018-01-17: 4 mg via INTRAVENOUS

## 2018-01-17 MED ORDER — PHENYLEPHRINE 40 MCG/ML (10ML) SYRINGE FOR IV PUSH (FOR BLOOD PRESSURE SUPPORT)
PREFILLED_SYRINGE | INTRAVENOUS | Status: AC
  Filled 2018-01-17: qty 10

## 2018-01-17 MED ORDER — VANCOMYCIN HCL 1000 MG IV SOLR
INTRAVENOUS | Status: AC
  Filled 2018-01-17: qty 1000

## 2018-01-17 MED ORDER — PHENYLEPHRINE 40 MCG/ML (10ML) SYRINGE FOR IV PUSH (FOR BLOOD PRESSURE SUPPORT)
PREFILLED_SYRINGE | INTRAVENOUS | Status: DC | PRN
  Administered 2018-01-17 (×2): 80 ug via INTRAVENOUS
  Administered 2018-01-17: 40 ug via INTRAVENOUS
  Administered 2018-01-17 (×2): 80 ug via INTRAVENOUS

## 2018-01-17 MED ORDER — 0.9 % SODIUM CHLORIDE (POUR BTL) OPTIME
TOPICAL | Status: DC | PRN
  Administered 2018-01-17: 2000 mL

## 2018-01-17 MED ORDER — MUPIROCIN 2 % EX OINT: 1.0000 "application " | TOPICAL_OINTMENT | Freq: Two times a day (BID) | CUTANEOUS | Status: DC

## 2018-01-17 MED ORDER — TOBRAMYCIN SULFATE 1.2 G IJ SOLR
INTRAMUSCULAR | Status: DC | PRN
  Administered 2018-01-17: 2.4 g via TOPICAL

## 2018-01-17 MED ORDER — FENTANYL CITRATE (PF) 100 MCG/2ML IJ SOLN: INTRAMUSCULAR | Status: DC | PRN

## 2018-01-17 MED ORDER — VANCOMYCIN HCL 1000 MG IV SOLR
INTRAVENOUS | Status: DC | PRN
  Administered 2018-01-17: 3 g via TOPICAL

## 2018-01-17 MED ORDER — PROPOFOL 10 MG/ML IV BOLUS
INTRAVENOUS | Status: DC | PRN
  Administered 2018-01-17: 50 mg via INTRAVENOUS

## 2018-01-17 MED ORDER — FENTANYL CITRATE (PF) 100 MCG/2ML IJ SOLN
INTRAMUSCULAR | Status: DC | PRN
  Administered 2018-01-17: 50 ug via INTRAVENOUS

## 2018-01-17 SURGICAL SUPPLY — 94 items
BANDAGE ACE 4X5 VEL STRL LF (GAUZE/BANDAGES/DRESSINGS) ×4 IMPLANT
BANDAGE ACE 6X5 VEL STRL LF (GAUZE/BANDAGES/DRESSINGS) ×4 IMPLANT
BANDAGE ELASTIC 4 VELCRO ST LF (GAUZE/BANDAGES/DRESSINGS) ×4 IMPLANT
BANDAGE ELASTIC 6 VELCRO ST LF (GAUZE/BANDAGES/DRESSINGS) ×4 IMPLANT
BIT DRILL 110X2.5XQCK CNCT (BIT) ×2 IMPLANT
BIT DRILL 2.5 (BIT) ×2
BIT DRILL 2.5X205 (BIT) ×2 IMPLANT
BIT DRILL CALIBRATED 4.3X320MM (BIT) ×2 IMPLANT
BIT DRILL CROWE POINT TWST 4.3 (DRILL) ×4 IMPLANT
BIT DRILL QC 110 3.5 (BIT) ×2
BIT DRILL QC 110 3.5MM (BIT) ×2 IMPLANT
BIT DRL 110X2.5XQCK CNCT (BIT) ×2
BLADE SURG 10 STRL SS (BLADE) ×8 IMPLANT
BNDG COHESIVE 4X5 TAN STRL (GAUZE/BANDAGES/DRESSINGS) ×4 IMPLANT
BNDG GAUZE ELAST 4 BULKY (GAUZE/BANDAGES/DRESSINGS) ×8 IMPLANT
BRUSH SCRUB SURG 4.25 DISP (MISCELLANEOUS) ×8 IMPLANT
CANISTER WOUND CARE 500ML ATS (WOUND CARE) ×4 IMPLANT
CHLORAPREP W/TINT 26ML (MISCELLANEOUS) ×4 IMPLANT
COVER MAYO STAND STRL (DRAPES) ×4 IMPLANT
COVER SURGICAL LIGHT HANDLE (MISCELLANEOUS) ×8 IMPLANT
COVER WAND RF STERILE (DRAPES) ×4 IMPLANT
DRAPE C-ARM 42X72 X-RAY (DRAPES) ×4 IMPLANT
DRAPE C-ARMOR (DRAPES) ×8 IMPLANT
DRAPE HALF SHEET 40X57 (DRAPES) ×8 IMPLANT
DRAPE IMP U-DRAPE 54X76 (DRAPES) ×16 IMPLANT
DRAPE INCISE IOBAN 66X45 STRL (DRAPES) IMPLANT
DRAPE ORTHO SPLIT 77X108 STRL (DRAPES) ×8
DRAPE SURG 17X23 STRL (DRAPES) ×4 IMPLANT
DRAPE SURG ORHT 6 SPLT 77X108 (DRAPES) ×8 IMPLANT
DRAPE U-SHAPE 47X51 STRL (DRAPES) ×4 IMPLANT
DRILL BIT 2.5X205 (BIT) ×2
DRILL BIT QC 110 3.5MM (BIT) ×2
DRILL CALIBRATED 4.3X320MM (BIT) ×4
DRILL CROWE POINT TWIST 4.3 (DRILL) ×8
DRSG ADAPTIC 3X8 NADH LF (GAUZE/BANDAGES/DRESSINGS) ×4 IMPLANT
DRSG MEPITEL 4X7.2 (GAUZE/BANDAGES/DRESSINGS) ×4 IMPLANT
ELECT REM PT RETURN 9FT ADLT (ELECTROSURGICAL) ×4
ELECTRODE REM PT RTRN 9FT ADLT (ELECTROSURGICAL) ×2 IMPLANT
EVACUATOR 1/8 PVC DRAIN (DRAIN) IMPLANT
GAUZE SPONGE 4X4 12PLY STRL (GAUZE/BANDAGES/DRESSINGS) ×4 IMPLANT
GAUZE SPONGE 4X4 12PLY STRL LF (GAUZE/BANDAGES/DRESSINGS) ×4 IMPLANT
GLOVE BIO SURGEON STRL SZ7.5 (GLOVE) ×16 IMPLANT
GLOVE BIOGEL PI IND STRL 7.5 (GLOVE) ×2 IMPLANT
GLOVE BIOGEL PI INDICATOR 7.5 (GLOVE) ×2
GOWN STRL REUS W/ TWL LRG LVL3 (GOWN DISPOSABLE) ×4 IMPLANT
GOWN STRL REUS W/TWL LRG LVL3 (GOWN DISPOSABLE) ×4
GUIDEWIRE 2.6X80 BEAD TIP (WIRE) ×2 IMPLANT
GUIDWIRE 2.6X80 BEAD TIP (WIRE) ×4
HANDPIECE INTERPULSE COAX TIP (DISPOSABLE)
K-WIRE TROCAR 1.6X150 (WIRE) ×4
KIT BASIN OR (CUSTOM PROCEDURE TRAY) ×4 IMPLANT
KIT PREVENA INCISION MGT 13 (CANNISTER) ×4 IMPLANT
KIT TURNOVER KIT B (KITS) ×4 IMPLANT
KWIRE TROCAR 1.6X150 (WIRE) ×2 IMPLANT
MANIFOLD NEPTUNE II (INSTRUMENTS) ×4 IMPLANT
NAIL PHOENIX TIBIAL 10.5X380MM (Orthopedic Implant) ×4 IMPLANT
NS IRRIG 1000ML POUR BTL (IV SOLUTION) ×4 IMPLANT
PACK ORTHO EXTREMITY (CUSTOM PROCEDURE TRAY) ×4 IMPLANT
PACK TOTAL JOINT (CUSTOM PROCEDURE TRAY) ×4 IMPLANT
PAD ARMBOARD 7.5X6 YLW CONV (MISCELLANEOUS) ×8 IMPLANT
PADDING CAST COTTON 6X4 STRL (CAST SUPPLIES) ×4 IMPLANT
PADDING CAST SYN 6 (CAST SUPPLIES) ×2
PADDING CAST SYNTHETIC 4 (CAST SUPPLIES) ×2
PADDING CAST SYNTHETIC 4X4 STR (CAST SUPPLIES) ×2 IMPLANT
PADDING CAST SYNTHETIC 6X4 NS (CAST SUPPLIES) ×2 IMPLANT
PENCIL BUTTON HOLSTER BLD 10FT (ELECTRODE) ×4 IMPLANT
SCREW CORT TI DBL LEAD 5X40 (Screw) ×8 IMPLANT
SCREW CORT TI DBL LEAD 5X42 (Screw) ×4 IMPLANT
SCREW CORT TI DBL LEAD 5X44 (Screw) ×4 IMPLANT
SCREW CORT TI DBL LEAD 5X75 (Screw) ×4 IMPLANT
SCREW LOCK 85X3.5X2.5XST SM (Screw) ×2 IMPLANT
SCREW LOCK FT ST SFX 3.5X65 (Screw) ×8 IMPLANT
SCREW LOCK FT ST SFX 3.5X80 (Screw) ×8 IMPLANT
SCREW LOCK FT ST SFX 3.5X85 (Screw) ×2 IMPLANT
SET HNDPC FAN SPRY TIP SCT (DISPOSABLE) IMPLANT
SPONGE LAP 18X18 X RAY DECT (DISPOSABLE) ×4 IMPLANT
STAPLER VISISTAT 35W (STAPLE) ×4 IMPLANT
SUT ETHILON 2 0 FS 18 (SUTURE) ×8 IMPLANT
SUT ETHILON 3 0 PS 1 (SUTURE) ×12 IMPLANT
SUT MNCRL AB 3-0 PS2 18 (SUTURE) ×4 IMPLANT
SUT MON AB 2-0 CT1 36 (SUTURE) ×4 IMPLANT
SUT PDS AB 0 CT 36 (SUTURE) IMPLANT
SUT VIC AB 0 CT1 27 (SUTURE)
SUT VIC AB 0 CT1 27XBRD ANBCTR (SUTURE) IMPLANT
SUT VIC AB 2-0 CT1 27 (SUTURE)
SUT VIC AB 2-0 CT1 TAPERPNT 27 (SUTURE) IMPLANT
SWAB CULTURE ESWAB REG 1ML (MISCELLANEOUS) IMPLANT
TOWEL OR 17X24 6PK STRL BLUE (TOWEL DISPOSABLE) ×4 IMPLANT
TOWEL OR 17X26 10 PK STRL BLUE (TOWEL DISPOSABLE) ×8 IMPLANT
TUBE CONNECTING 12'X1/4 (SUCTIONS) ×2
TUBE CONNECTING 12X1/4 (SUCTIONS) ×6 IMPLANT
UNDERPAD 30X30 (UNDERPADS AND DIAPERS) ×4 IMPLANT
WATER STERILE IRR 1000ML POUR (IV SOLUTION) ×4 IMPLANT
YANKAUER SUCT BULB TIP NO VENT (SUCTIONS) ×4 IMPLANT

## 2018-01-17 NOTE — Progress Notes (Signed)
Patient just arrived back to 4N22 from the OR.

## 2018-01-17 NOTE — Progress Notes (Signed)
RT Note:  Bite block placed due to pt biting ETT.
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
## 2018-01-17 NOTE — Anesthesia Preprocedure Evaluation (Addendum)
Anesthesia Evaluation  Patient identified by MRN, date of birth, ID bandGeneral Assessment Comment:Intubated and sedated  Reviewed: Allergy & Precautions, NPO status , Patient's Chart, lab work & pertinent test results, Unable to perform ROS - Chart review only  Airway Mallampati: Intubated       Dental no notable dental hx.    Pulmonary neg pulmonary ROS,    Pulmonary exam normal        Cardiovascular negative cardio ROS Normal cardiovascular exam     Neuro/Psych negative neurological ROS  negative psych ROS   GI/Hepatic negative GI ROS, Neg liver ROS,   Endo/Other  negative endocrine ROS  Renal/GU negative Renal ROS  negative genitourinary   Musculoskeletal negative musculoskeletal ROS (+)   Abdominal Normal abdominal exam  (+)   Peds negative pediatric ROS (+)  Hematology negative hematology ROS (+)   Anesthesia Other Findings   Reproductive/Obstetrics negative OB ROS                             Anesthesia Physical Anesthesia Plan  ASA: III  Anesthesia Plan: General   Post-op Pain Management:    Induction:   PONV Risk Score and Plan: 2 and Treatment may vary due to age or medical condition  Airway Management Planned: Oral ETT  Additional Equipment:   Intra-op Plan:   Post-operative Plan: Extubation in OR, Post-operative intubation/ventilation and Possible Post-op intubation/ventilation  Informed Consent: I have reviewed the patients History and Physical, chart, labs and discussed the procedure including the risks, benefits and alternatives for the proposed anesthesia with the patient or authorized representative who has indicated his/her understanding and acceptance.     Plan Discussed with:   Anesthesia Plan Comments: (Existing ETT, ventilated)       Anesthesia Quick Evaluation

## 2018-01-17 NOTE — Anesthesia Postprocedure Evaluation (Signed)
Anesthesia Post Note  Patient: Wesley Sandoval  Procedure(s) Performed: INTRAMEDULLARY (IM) NAIL RIGHT TIBIA (Right ) IRRIGATION AND DEBRIDEMENT OF LEFT OPEN FEMUR AND LEFT TIBIA FRACTURE, ORIF OF LEFT DISTAL FEMUR (Left Leg Upper)     Patient location during evaluation: PACU Anesthesia Type: General Level of consciousness: awake and alert Pain management: pain level controlled Vital Signs Assessment: post-procedure vital signs reviewed and stable Respiratory status: spontaneous breathing, nonlabored ventilation, respiratory function stable and patient connected to nasal cannula oxygen Cardiovascular status: blood pressure returned to baseline and stable Postop Assessment: no apparent nausea or vomiting Anesthetic complications: no    Last Vitals:  Vitals:   01/17/18 0700 01/17/18 1400  BP: 109/66 (!) 137/101  Pulse: (!) 108 (!) 107  Resp: 18 (!) 24  Temp:    SpO2: 100% 98%    Last Pain:  Vitals:   01/17/18 0500  TempSrc: Oral  PainSc:                  Phillips Grout
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
## 2018-01-17 NOTE — Progress Notes (Signed)
Follow up - Trauma and Critical Care  Patient Details:    Wesley Sandoval is an 32 y.o. male.  Lines/tubes : Airway 7.5 mm (Active)  Secured at (cm) 28 cm 01/17/2018  7:42 AM  Measured From Lips 01/17/2018  7:42 AM  Secured Location Left 01/17/2018  7:42 AM  Secured By [employer] 01/17/2018  7:42 AM  Tube Holder Repositioned Yes 01/17/2018  3:25 AM  Cuff Pressure (cm H2O) 26 cm H2O 01/16/2018  8:15 PM  Site Condition Dry 01/17/2018  7:42 AM     Arterial Line 01/15/18 Radial (Active)  Site Assessment Clean;Dry;Intact 01/17/2018  7:30 AM  Line Status Pulsatile blood flow 01/17/2018  7:30 AM  Art Line Waveform Appropriate 01/17/2018  7:30 AM  Art Line Interventions Zeroed and calibrated;Leveled;Connections checked and tightened;Flushed per protocol 01/17/2018  7:30 AM  Color/Movement/Sensation Capillary refill less than 3 sec;Cool fingers/toes 01/17/2018  7:30 AM  Dressing Type Transparent;Occlusive 01/17/2018  7:30 AM  Dressing Status Clean;Dry;Intact;Antimicrobial disc in place 01/17/2018  7:30 AM  Dressing Change Due 01/22/18 01/17/2018  7:30 AM     Chest Tube Lateral;Right (Active)  Suction -20 cm H2O 01/17/2018  7:30 AM  Chest Tube Air Leak None 01/17/2018  7:30 AM  Patency Intervention Tip/tilt;Milked 01/17/2018  7:30 AM  Drainage Description Dark red 01/17/2018  7:30 AM  Dressing Status Clean;Dry;Intact 01/17/2018  7:30 AM  Site Assessment Clean;Dry;Intact 01/17/2018  7:30 AM  Surrounding Skin Unable to view 01/17/2018  7:30 AM  Output (mL) 30 mL 01/17/2018  5:00 AM     Negative Pressure Wound Therapy Leg Left (Active)     Negative Pressure Wound Therapy Leg Right (Active)     NG/OG Tube Orogastric 14 Fr. Xray Measured external length of tube 57 cm (Active)  External Length of Tube (cm) - (if applicable) 57 cm 01/15/2018  8:00 PM  Site Assessment Clean;Dry;Intact 01/17/2018  7:30 AM  Ongoing Placement Verification No change in respiratory status;No acute  changes, not attributed to clinical condition;Xray 01/17/2018  7:30 AM  Status Clamped 01/17/2018  7:30 AM  Amount of suction 90 mmHg 01/15/2018  8:00 PM  Drainage Appearance Bile 01/15/2018  8:00 PM  Intake (mL) 150 mL 01/16/2018 11:00 PM  Output (mL) 350 mL 01/16/2018  6:00 AM     Urethral Catheter V. DiMattia RN Latex 16 Fr. (Active)  Indication for Insertion or Continuance of Catheter Peri-operative use for selective surgical procedure 01/17/2018  8:00 AM  Site Assessment Clean;Intact 01/17/2018  7:30 AM  Catheter Maintenance Bag below level of bladder;Catheter secured;Drainage bag/tubing not touching floor;No dependent loops;Seal intact;Insertion date on drainage bag;Bag emptied prior to transport 01/17/2018  8:00 AM  Collection Container Standard drainage bag 01/17/2018  7:30 AM  Securement Method Securing device (Describe) 01/17/2018  7:30 AM  Urinary Catheter Interventions Unclamped 01/17/2018  7:30 AM  Output (mL) 125 mL 01/17/2018  5:00 AM    Microbiology/Sepsis markers: Results for orders placed or performed during the hospital encounter of 01/13/18  MRSA PCR Screening     Status: None   Collection Time: 01/13/18 11:27 PM  Result Value Ref Range Status   MRSA by PCR NEGATIVE NEGATIVE Final    Comment:        The GeneXpert MRSA Assay (FDA approved for NASAL specimens only), is one component of a comprehensive MRSA colonization surveillance program. It is not intended to diagnose MRSA infection nor to guide or monitor treatment for MRSA infections. Performed at [facility], [address], [place], [place] [address]   MRSA PCR Screening     Status: None   Collection Time: 01/13/18 11:49 PM  Result Value Ref Range Status   MRSA by PCR NEGATIVE NEGATIVE Final    Comment:        The GeneXpert MRSA Assay (FDA approved for NASAL specimens only), is one component of a comprehensive MRSA colonization surveillance program. It is not intended to diagnose  MRSA infection nor to guide or monitor treatment for MRSA infections. Performed at [facility], [address], [place], [place] [address]   Surgical PCR screen     Status: None   Collection Time: 01/17/18  4:50 AM  Result Value Ref Range Status   MRSA, PCR NEGATIVE NEGATIVE Final   Staphylococcus aureus NEGATIVE NEGATIVE Final    Comment: (NOTE) The Xpert SA Assay (FDA approved for NASAL specimens in patients 75 years of age and older), is one component of a comprehensive surveillance program. It is not intended to diagnose infection nor to guide or monitor treatment. Performed at [facility], [address] [place], [place] [address]     Anti-infectives:  Anti-infectives (From admission, onward)   Start     Dose/Rate Route Frequency Ordered Stop   01/17/18 1102  tobramycin (NEBCIN) powder  Status:  Discontinued       As needed 01/17/18 1102 01/17/18 1326   01/17/18 1102  vancomycin (VANCOCIN) powder  Status:  Discontinued       As needed 01/17/18 1103 01/17/18 1326   01/17/18 0600  ceFAZolin (ANCEF) IVPB 2g/100 mL premix     2 g 200 mL/hr over 30 Minutes Intravenous To Short Stay 01/17/18 0445 01/17/18 1250   01/16/18 1600  cefTRIAXone (ROCEPHIN) 2 g in sodium chloride 0.9 % 100 mL IVPB     2 g 200 mL/hr over 30 Minutes Intravenous Every 24 hours 01/15/18 1850     01/15/18 1615  ceFAZolin (ANCEF) IVPB 2g/100 mL premix  Status:  Discontinued     2 g 200 mL/hr over 30 Minutes Intravenous On call to O.R. 01/15/18 1604 01/15/18 1903   01/15/18 1615  cefTRIAXone (ROCEPHIN) 2 g in sodium chloride 0.9 % 100 mL IVPB     2 g 200 mL/hr over 30 Minutes Intravenous To Surgery 01/15/18 1609 01/15/18 1900   01/15/18 1612  tobramycin (NEBCIN) powder  Status:  Discontinued       As needed 01/15/18 1612 01/15/18 1849   01/15/18 1610  vancomycin (VANCOCIN) powder  Status:  Discontinued       As needed 01/15/18 1610 01/15/18 1849   01/14/18 0000  ceFAZolin (ANCEF) IVPB 1  g/50 mL premix  Status:  Discontinued     1 g 100 mL/hr over 30 Minutes Intravenous Every 8 hours 01/13/18 2326 01/15/18 1916   01/13/18 1930  ceFAZolin (ANCEF) IVPB 2g/100 mL premix     2 g 200 mL/hr over 30 Minutes Intravenous  Once 01/13/18 1924 01/13/18 2022   01/13/18 1922  ceFAZolin (ANCEF) 2-4 GM/100ML-% IVPB    Note to Pharmacy:  Blanchard Kelch   : cabinet override      01/13/18 1922 01/14/18 0729      Best Practice/Protocols:  VTE Prophylaxis: Lovenox (prophylaxtic dose) and Mechanical GI Prophylaxis: Proton Pump Inhibitor Continous Sedation  Consults: Treatment Team:  Myrene Galas, MD    Events:  Subjective:    Overnight Issues: Patient went back to OR today.  Scheduled to go back on Friday.  Objective:  Vital signs for last 24 hours: Temp:  [99.4 F (37.4 C)-102.6 F (39.2 C)] 100 F (37.8 C) (10/30 0500) Pulse Rate:  [95-125] 108 (10/30 0700) Resp:  [16-27] 18 (10/30 0700) BP: (88-137)/(55-98) 109/66 (10/30 0700) SpO2:  [99 %-100 %] 100 % (10/30 0700) Arterial Line BP: (77-167)/(47-88) 150/63 (10/30 0600) FiO2 (%):  [40 %] 40 % (10/30 0742) Weight:  [106.7 kg] 106.7 kg (10/30 0431)  Hemodynamic parameters for last 24 hours:    Intake/Output from previous day: 10/29 0701 - 10/30 0700 In: 4852.8 [I.V.:4010.3; NG/GT:642.7; IV Piggyback:199.9] Out: 1435 [Urine:1125; Drains:30; Chest Tube:280]  Intake/Output this shift: Total I/O In: 2998 [I.V.:2367; Blood:631] Out: 1550 [Urine:1250; Blood:300]  Vent settings for last 24 hours: Vent Mode: PRVC FiO2 (%):  [40 %] 40 % Set Rate:  [18 bmp] 18 bmp Vt Set:  [630 mL] 630 mL PEEP:  [8 cmH20] 8 cmH20 Plateau Pressure:  [20 cmH20-22 cmH20] 21 cmH20  Physical Exam:  General: no respiratory distress Neuro: RASS -2 Resp: diminished breath sounds RLL and RML CVS: regular rate and rhythm, S1, S2 normal, no murmur, click, rub or gallop GI: Good bowel sounds.  Had been tolerating tube feedings prior to  surgery. Extremities: pulses doppler,  and External fixator on the left.  Has Ace wrap on the right.  Results for orders placed or performed during the hospital encounter of 01/13/18 (from the past 24 hour(s))  Glucose, capillary     Status: Abnormal   Collection Time: 01/16/18  3:34 PM  Result Value Ref Range   Glucose-Capillary 124 (H) 70 - 99 mg/dL  Magnesium     Status: None   Collection Time: 01/16/18  4:57 PM  Result Value Ref Range   Magnesium 1.9 1.7 - 2.4 mg/dL  Phosphorus     Status: Abnormal   Collection Time: 01/16/18  4:57 PM  Result Value Ref Range   Phosphorus 1.3 (L) 2.5 - 4.6 mg/dL  Glucose, capillary     Status: Abnormal   Collection Time: 01/16/18  7:43 PM  Result Value Ref Range   Glucose-Capillary 120 (H) 70 - 99 mg/dL  Glucose, capillary     Status: Abnormal   Collection Time: 01/16/18 11:41 PM  Result Value Ref Range   Glucose-Capillary 125 (H) 70 - 99 mg/dL  Glucose, capillary     Status: None   Collection Time: 01/17/18  3:25 AM  Result Value Ref Range   Glucose-Capillary 84 70 - 99 mg/dL  CBC     Status: Abnormal   Collection Time: 01/17/18  4:32 AM  Result Value Ref Range   WBC 11.0 (H) 4.0 - 10.5 K/uL   RBC 2.77 (L) 4.22 - 5.81 MIL/uL   Hemoglobin 8.0 (L) 13.0 - 17.0 g/dL   HCT 16.1 (L) 09.6 - 04.5 %   MCV 91.7 80.0 - 100.0 fL   MCH 28.9 26.0 - 34.0 pg   MCHC 31.5 30.0 - 36.0 g/dL   RDW 40.9 81.1 - 91.4 %   Platelets 92 (L) 150 - 400 K/uL   nRBC 0.0 0.0 - 0.2 %  Basic metabolic panel     Status: Abnormal   Collection Time: 01/17/18  4:32 AM  Result Value Ref Range   Sodium 142 135 - 145 mmol/L   Potassium 3.5 3.5 - 5.1 mmol/L   Chloride 113 (H) 98 - 111 mmol/L   CO2 24 22 - 32 mmol/L   Glucose, Bld 112 (H) 70 - 99 mg/dL  BUN 13 6 - 20 mg/dL   Creatinine, Ser 1.61 0.61 - 1.24 mg/dL   Calcium 7.5 (L) 8.9 - 10.3 mg/dL   GFR calc non Af Amer >60 >60 mL/min   GFR calc Af Amer >60 >60 mL/min   Anion gap 5 5 - 15  Magnesium     Status: None    Collection Time: 01/17/18  4:32 AM  Result Value Ref Range   Magnesium 2.1 1.7 - 2.4 mg/dL  Phosphorus     Status: Abnormal   Collection Time: 01/17/18  4:32 AM  Result Value Ref Range   Phosphorus 1.9 (L) 2.5 - 4.6 mg/dL  Triglycerides     Status: Abnormal   Collection Time: 01/17/18  4:32 AM  Result Value Ref Range   Triglycerides 262 (H) <150 mg/dL  Surgical PCR screen     Status: None   Collection Time: 01/17/18  4:50 AM  Result Value Ref Range   MRSA, PCR NEGATIVE NEGATIVE   Staphylococcus aureus NEGATIVE NEGATIVE  Prepare RBC     Status: None   Collection Time: 01/17/18  6:43 AM  Result Value Ref Range   Order Confirmation      ORDER PROCESSED BY BLOOD BANK Performed at [facility], [address], [place], [place] [address]   Type and screen [facility]     Status: None (Preliminary result)   Collection Time: 01/17/18  6:50 AM  Result Value Ref Range   ABO/RH(D) AB POS    Antibody Screen NEG    Sample Expiration 01/20/2018    Unit Number [id number]    Blood Component Type RBC LR PHER1    Unit division 00    Status of Unit ISSUED    Transfusion Status OK TO TRANSFUSE    Crossmatch Result      Compatible Performed at [facility], [address], [place], [place] [address]    Unit Number [id number]    Blood Component Type RED CELLS,LR    Unit division 00    Status of Unit ISSUED    Transfusion Status OK TO TRANSFUSE    Crossmatch Result Compatible   Glucose, capillary     Status: None   Collection Time: 01/17/18  7:41 AM  Result Value Ref Range   Glucose-Capillary 98 70 - 99 mg/dL   Comment 1 Notify RN    Comment 2 Document in Chart   Prepare RBC     Status: None   Collection Time: 01/17/18 11:10 AM  Result Value Ref Range   Order Confirmation      ORDER PROCESSED BY BLOOD BANK Performed at [facility], [address], [place], [place] [address]      Assessment/Plan:   NEURO  Altered Mental Status:  coma  and sedation   Plan: No attempts being  Made to wean the patient currently.  PULM  Atelectasis/collapse (focal and right base)   Plan: CPM.  CXR tomorrow AM, also one now.  CARDIO  Sinus Tachycardia   Plan: No specific treatment  RENAL  Hypervolemia   Plan: Urine output is  Marginal.  Is over 15 liter positive  GI  No specific issues   Plan: Restart t ube feedings.  ID  No known infectious sources currently.   Plan: CPM  HEME  Anemia acute blood loss anemia)   Plan: NO blood for now  ENDO No significant issues   Plan: CPM  Global Issues  Patient likely to  go to OR again Friday.  Not weaning from the ventilator.  CXR today is pending.  ABG pending.      LOS: 4 days   Additional comments:I reviewed the patient's new clinical lab test results. cbc/bmet/abg, I reviewed the patients new imaging test results. cxr pending. and I have discussed and reviewed with family members patient's cousin and wife  Critical Care Total Time*: 73 Minutes  Jimmye Norman 01/17/2018  *Care during the described time interval was provided by me and/or other providers on the critical care team.  I have reviewed this patient's available data, including medical history, events of note, physical examination and test results as part of my evaluation.

## 2018-01-17 NOTE — Transfer of Care (Signed)
Immediate Anesthesia Transfer of Care Note  Patient: Wesley Sandoval  Procedure(s) Performed: INTRAMEDULLARY (IM) NAIL RIGHT TIBIA (Right ) IRRIGATION AND DEBRIDEMENT OF LEFT OPEN FEMUR AND LEFT TIBIA FRACTURE, ORIF OF LEFT DISTAL FEMUR (Left Leg Upper)  Patient Location: PACU and ICU  Anesthesia Type:General  Level of Consciousness: sedated  Airway & Oxygen Therapy: Patient remains intubated per anesthesia plan  Post-op Assessment: Report given to RN and Post -op Vital signs reviewed and stable  Post vital signs: Reviewed and stable  Last Vitals:  Vitals Value Taken Time  BP    Temp    Pulse 106 01/17/2018  1:43 PM  Resp 18 01/17/2018  1:43 PM  SpO2 96 % 01/17/2018  1:43 PM  Vitals shown include unvalidated device data.  Last Pain:  Vitals:   01/17/18 0500  TempSrc: Oral  PainSc:          Complications: No apparent anesthesia complications

## 2018-01-18 ENCOUNTER — Inpatient Hospital Stay (HOSPITAL_COMMUNITY): Payer: BLUE CROSS/BLUE SHIELD

## 2018-01-18 ENCOUNTER — Encounter (HOSPITAL_COMMUNITY): Payer: Self-pay | Admitting: Student

## 2018-01-18 LAB — GLUCOSE, CAPILLARY
GLUCOSE-CAPILLARY: 123 mg/dL — AB (ref 70–99)
GLUCOSE-CAPILLARY: 118 mg/dL — AB (ref 70–99)
GLUCOSE-CAPILLARY: 115 mg/dL — AB (ref 70–99)
Glucose-Capillary: 126 mg/dL — ABNORMAL HIGH (ref 70–99)
Glucose-Capillary: 113 mg/dL — ABNORMAL HIGH (ref 70–99)
Glucose-Capillary: 105 mg/dL — ABNORMAL HIGH (ref 70–99)

## 2018-01-18 LAB — CBC
HCT: 27.2 % — ABNORMAL LOW (ref 39.0–52.0)
HEMOGLOBIN: 8.8 g/dL — AB (ref 13.0–17.0)
MCH: 28.7 pg (ref 26.0–34.0)
MCHC: 32.4 g/dL (ref 30.0–36.0)
MCV: 88.6 fL (ref 80.0–100.0)
PLATELETS: 165 10*3/uL (ref 150–400)
RBC: 3.07 MIL/uL — ABNORMAL LOW (ref 4.22–5.81)
RDW: 14.8 % (ref 11.5–15.5)
WBC: 13.1 10*3/uL — ABNORMAL HIGH (ref 4.0–10.5)
nRBC: 0 % (ref 0.0–0.2)

## 2018-01-18 MED ORDER — ENOXAPARIN SODIUM 40 MG/0.4ML ~~LOC~~ SOLN
40.0000 mg | SUBCUTANEOUS | Status: DC
  Administered 2018-01-18 – 2018-01-25 (×8): 40 mg via SUBCUTANEOUS
  Filled 2018-01-18 (×8): qty 0.4

## 2018-01-18 MED ORDER — CLONAZEPAM 0.5 MG PO TABS
0.5000 mg | ORAL_TABLET | Freq: Three times a day (TID) | ORAL | Status: DC
  Administered 2018-01-18 – 2018-01-20 (×7): 0.5 mg
  Filled 2018-01-18 (×8): qty 1

## 2018-01-18 MED ORDER — QUETIAPINE FUMARATE 25 MG PO TABS
50.0000 mg | ORAL_TABLET | Freq: Three times a day (TID) | ORAL | Status: DC
  Administered 2018-01-18 – 2018-01-20 (×7): 50 mg
  Filled 2018-01-18 (×8): qty 2

## 2018-01-18 MED ORDER — SIMETHICONE 40 MG/0.6ML PO SUSP
40.0000 mg | Freq: Four times a day (QID) | ORAL | Status: DC | PRN
  Administered 2018-01-23: 40 mg
  Filled 2018-01-18 (×2): qty 0.6
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
## 2018-01-18 NOTE — Progress Notes (Signed)
RN called to place pt back on full vent support d/t pt appears uncomfortable and RN administering sedation.
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
## 2018-01-19 ENCOUNTER — Inpatient Hospital Stay (HOSPITAL_COMMUNITY): Payer: BLUE CROSS/BLUE SHIELD

## 2018-01-19 ENCOUNTER — Encounter (HOSPITAL_COMMUNITY): Admission: EM | Disposition: A | Discharge status: Home Health | Payer: Self-pay | Source: Home / Self Care

## 2018-01-19 ENCOUNTER — Inpatient Hospital Stay (HOSPITAL_COMMUNITY): Payer: BLUE CROSS/BLUE SHIELD | Admitting: Anesthesiology

## 2018-01-19 HISTORY — PX: ORIF FEMUR FRACTURE: SHX2119

## 2018-01-19 HISTORY — PX: TIBIA IM NAIL INSERTION: SHX2516

## 2018-01-19 LAB — CBC
HCT: 25.7 % — ABNORMAL LOW (ref 39.0–52.0)
HEMOGLOBIN: 7.8 g/dL — AB (ref 13.0–17.0)
MCH: 27.4 pg (ref 26.0–34.0)
MCHC: 30.4 g/dL (ref 30.0–36.0)
MCV: 90.2 fL (ref 80.0–100.0)
Platelets: 219 10*3/uL (ref 150–400)
RBC: 2.85 MIL/uL — AB (ref 4.22–5.81)
RDW: 15.3 % (ref 11.5–15.5)
WBC: 10.4 10*3/uL (ref 4.0–10.5)
nRBC: 0.6 % — ABNORMAL HIGH (ref 0.0–0.2)

## 2018-01-19 LAB — BASIC METABOLIC PANEL
Anion gap: 5 (ref 5–15)
BUN: 21 mg/dL — AB (ref 6–20)
CHLORIDE: 116 mmol/L — AB (ref 98–111)
CO2: 26 mmol/L (ref 22–32)
Calcium: 7.5 mg/dL — ABNORMAL LOW (ref 8.9–10.3)
Creatinine, Ser: 0.94 mg/dL (ref 0.61–1.24)
GFR calc Af Amer: >60 mL/min (ref >60)
GFR calc non Af Amer: >60 mL/min (ref >60)
GLUCOSE: 102 mg/dL — AB (ref 70–99)
POTASSIUM: 3.1 mmol/L — AB (ref 3.5–5.1)
Sodium: 147 mmol/L — ABNORMAL HIGH (ref 135–145)

## 2018-01-19 LAB — GLUCOSE, CAPILLARY
GLUCOSE-CAPILLARY: 93 mg/dL (ref 70–99)
GLUCOSE-CAPILLARY: 101 mg/dL — AB (ref 70–99)
GLUCOSE-CAPILLARY: 122 mg/dL — AB (ref 70–99)
GLUCOSE-CAPILLARY: 133 mg/dL — AB (ref 70–99)

## 2018-01-19 LAB — PREPARE RBC (CROSSMATCH)

## 2018-01-19 SURGERY — OPEN REDUCTION INTERNAL FIXATION (ORIF) DISTAL FEMUR FRACTURE
Anesthesia: General | Laterality: Left

## 2018-01-19 MED ORDER — 0.9 % SODIUM CHLORIDE (POUR BTL) OPTIME
TOPICAL | Status: DC | PRN
  Administered 2018-01-19: 1000 mL

## 2018-01-19 MED ORDER — SODIUM CHLORIDE 0.9 % IR SOLN
Status: DC | PRN
  Administered 2018-01-19 (×2): 3000 mL

## 2018-01-19 MED ORDER — ALBUMIN HUMAN 5 % IV SOLN
INTRAVENOUS | Status: DC | PRN
  Administered 2018-01-19: 11:00:00 via INTRAVENOUS

## 2018-01-19 MED ORDER — TOBRAMYCIN SULFATE 1.2 G IJ SOLR
INTRAMUSCULAR | Status: AC
  Filled 2018-01-19: qty 1.2

## 2018-01-19 MED ORDER — PANTOPRAZOLE SODIUM 40 MG PO TBEC: 40.0000 mg | DELAYED_RELEASE_TABLET | Freq: Every day | ORAL | Status: DC

## 2018-01-19 MED ORDER — LACTATED RINGERS IV SOLN
INTRAVENOUS | Status: DC | PRN
  Administered 2018-01-19: 08:00:00 via INTRAVENOUS

## 2018-01-19 MED ORDER — ROCURONIUM BROMIDE 50 MG/5ML IV SOSY
PREFILLED_SYRINGE | INTRAVENOUS | Status: AC
  Filled 2018-01-19: qty 5

## 2018-01-19 MED ORDER — TOBRAMYCIN SULFATE 1.2 G IJ SOLR
INTRAMUSCULAR | Status: DC | PRN
  Administered 2018-01-19: 1.2 g via TOPICAL
  Administered 2018-01-19: 2.4 g

## 2018-01-19 MED ORDER — ONDANSETRON HCL 4 MG/2ML IJ SOLN
INTRAMUSCULAR | Status: AC
  Filled 2018-01-19: qty 2

## 2018-01-19 MED ORDER — ONDANSETRON HCL 4 MG/2ML IJ SOLN
INTRAMUSCULAR | Status: DC | PRN
  Administered 2018-01-19: 4 mg via INTRAVENOUS

## 2018-01-19 MED ORDER — ARTIFICIAL TEARS OPHTHALMIC OINT
TOPICAL_OINTMENT | OPHTHALMIC | Status: DC | PRN
  Administered 2018-01-19: 1 via OPHTHALMIC

## 2018-01-19 MED ORDER — FENTANYL CITRATE (PF) 100 MCG/2ML IJ SOLN
INTRAMUSCULAR | Status: DC | PRN
  Administered 2018-01-19: 150 ug via INTRAVENOUS
  Administered 2018-01-19: 50 ug via INTRAVENOUS
  Administered 2018-01-19 (×2): 100 ug via INTRAVENOUS
  Administered 2018-01-19 (×2): 50 ug via INTRAVENOUS

## 2018-01-19 MED ORDER — PROPOFOL 10 MG/ML IV BOLUS
INTRAVENOUS | Status: AC
  Filled 2018-01-19: qty 40

## 2018-01-19 MED ORDER — MIDAZOLAM HCL 5 MG/5ML IJ SOLN
INTRAMUSCULAR | Status: DC | PRN
  Administered 2018-01-19: 2 mg via INTRAVENOUS

## 2018-01-19 MED ORDER — CEFAZOLIN SODIUM-DEXTROSE 2-3 GM-%(50ML) IV SOLR
INTRAVENOUS | Status: DC | PRN
  Administered 2018-01-19: 2 g via INTRAVENOUS

## 2018-01-19 MED ORDER — VANCOMYCIN HCL 1000 MG IV SOLR
INTRAVENOUS | Status: AC
  Filled 2018-01-19: qty 1000

## 2018-01-19 MED ORDER — FENTANYL CITRATE (PF) 250 MCG/5ML IJ SOLN
INTRAMUSCULAR | Status: AC
  Filled 2018-01-19: qty 5

## 2018-01-19 MED ORDER — PROPOFOL 500 MG/50ML IV EMUL
INTRAVENOUS | Status: DC | PRN
  Administered 2018-01-19: 75 ug/kg/min via INTRAVENOUS
  Administered 2018-01-19: 50 ug/kg/min via INTRAVENOUS

## 2018-01-19 MED ORDER — VANCOMYCIN HCL 1000 MG IV SOLR
INTRAVENOUS | Status: DC | PRN
  Administered 2018-01-19: 1000 mg via TOPICAL
  Administered 2018-01-19: 2 g

## 2018-01-19 MED ORDER — BACITRACIN ZINC 500 UNIT/GM EX OINT
TOPICAL_OINTMENT | CUTANEOUS | Status: AC
  Filled 2018-01-19: qty 28.35

## 2018-01-19 MED ORDER — ROCURONIUM BROMIDE 50 MG/5ML IV SOSY
PREFILLED_SYRINGE | INTRAVENOUS | Status: DC | PRN
  Administered 2018-01-19 (×3): 50 mg via INTRAVENOUS

## 2018-01-19 MED ORDER — MIDAZOLAM HCL 2 MG/2ML IJ SOLN
INTRAMUSCULAR | Status: AC
  Filled 2018-01-19: qty 2

## 2018-01-19 MED ORDER — POTASSIUM CHLORIDE 10 MEQ/100ML IV SOLN
10.0000 meq | INTRAVENOUS | Status: AC
  Administered 2018-01-19 (×3): 10 meq via INTRAVENOUS
  Filled 2018-01-19 (×3): qty 100

## 2018-01-19 MED ORDER — ROCURONIUM BROMIDE 50 MG/5ML IV SOSY
PREFILLED_SYRINGE | INTRAVENOUS | Status: AC
  Filled 2018-01-19: qty 10

## 2018-01-19 SURGICAL SUPPLY — 107 items
BANDAGE ACE 4X5 VEL STRL LF (GAUZE/BANDAGES/DRESSINGS) ×3 IMPLANT
BANDAGE ACE 6X5 VEL STRL LF (GAUZE/BANDAGES/DRESSINGS) ×3 IMPLANT
BANDAGE ELASTIC 4 VELCRO ST LF (GAUZE/BANDAGES/DRESSINGS) ×3 IMPLANT
BIT DRILL 4.3 (BIT) ×3 IMPLANT
BIT DRILL 4.3MM (BIT) ×1
BIT DRILL 4.3X300MM (BIT) ×1 IMPLANT
BIT DRILL CALIBRATED 4.3X320MM (BIT) ×1 IMPLANT
BIT DRILL CROWE POINT TWST 4.3 (DRILL) ×2 IMPLANT
BIT DRILL LONG 3.3 (BIT) ×2 IMPLANT
BIT DRILL LONG 3.3MM (BIT) ×1
BIT DRILL QC 3.3X195 (BIT) ×3 IMPLANT
BLADE CLIPPER SURG (BLADE) IMPLANT
BLADE SURG 10 STRL SS (BLADE) ×6 IMPLANT
BNDG COHESIVE 4X5 TAN STRL (GAUZE/BANDAGES/DRESSINGS) ×3 IMPLANT
BNDG COHESIVE 6X5 TAN STRL LF (GAUZE/BANDAGES/DRESSINGS) ×3 IMPLANT
BNDG ELASTIC 6X10 VLCR STRL LF (GAUZE/BANDAGES/DRESSINGS) ×3 IMPLANT
BNDG GAUZE ELAST 4 BULKY (GAUZE/BANDAGES/DRESSINGS) ×3 IMPLANT
BRUSH SCRUB SURG 4.25 DISP (MISCELLANEOUS) ×6 IMPLANT
CANISTER SUCT 3000ML PPV (MISCELLANEOUS) ×3 IMPLANT
CANISTER WOUND CARE 500ML ATS (WOUND CARE) ×3 IMPLANT
CAP LOCK NCB (Cap) ×27 IMPLANT
CEMENT BONE R 1X40 (Cement) ×9 IMPLANT
CHLORAPREP W/TINT 26ML (MISCELLANEOUS) ×3 IMPLANT
COVER SURGICAL LIGHT HANDLE (MISCELLANEOUS) ×6 IMPLANT
COVER WAND RF STERILE (DRAPES) ×3 IMPLANT
DRAPE C-ARM 42X72 X-RAY (DRAPES) ×3 IMPLANT
DRAPE C-ARMOR (DRAPES) ×3 IMPLANT
DRAPE HALF SHEET 40X57 (DRAPES) ×6 IMPLANT
DRAPE IMP U-DRAPE 54X76 (DRAPES) ×6 IMPLANT
DRAPE INCISE IOBAN 66X45 STRL (DRAPES) ×3 IMPLANT
DRAPE ORTHO SPLIT 77X108 STRL (DRAPES) ×4
DRAPE PROXIMA HALF (DRAPES) ×6 IMPLANT
DRAPE SURG 17X23 STRL (DRAPES) ×3 IMPLANT
DRAPE SURG ORHT 6 SPLT 77X108 (DRAPES) ×2 IMPLANT
DRAPE U-SHAPE 47X51 STRL (DRAPES) ×3 IMPLANT
DRILL BIT 4.3 (BIT) ×2
DRILL CALIBRATED 4.3X320MM (BIT) ×3
DRILL CROWE POINT TWIST 4.3 (DRILL) ×6
DRSG ADAPTIC 3X8 NADH LF (GAUZE/BANDAGES/DRESSINGS) ×3 IMPLANT
DRSG MEPILEX BORDER 4X12 (GAUZE/BANDAGES/DRESSINGS) ×3 IMPLANT
DRSG MEPILEX BORDER 4X4 (GAUZE/BANDAGES/DRESSINGS) ×3 IMPLANT
DRSG MEPILEX BORDER 4X8 (GAUZE/BANDAGES/DRESSINGS) IMPLANT
DRSG MEPITEL 4X7.2 (GAUZE/BANDAGES/DRESSINGS) ×3 IMPLANT
DRSG PAD ABDOMINAL 8X10 ST (GAUZE/BANDAGES/DRESSINGS) ×9 IMPLANT
DRSG VAC ATS MED SENSATRAC (GAUZE/BANDAGES/DRESSINGS) ×3 IMPLANT
ELECT REM PT RETURN 9FT ADLT (ELECTROSURGICAL) ×3
ELECTRODE REM PT RTRN 9FT ADLT (ELECTROSURGICAL) ×1 IMPLANT
GAUZE SPONGE 4X4 12PLY STRL (GAUZE/BANDAGES/DRESSINGS) ×3 IMPLANT
GAUZE SPONGE 4X4 12PLY STRL LF (GAUZE/BANDAGES/DRESSINGS) ×6 IMPLANT
GLOVE BIO SURGEON STRL SZ7.5 (GLOVE) ×12 IMPLANT
GLOVE BIOGEL PI IND STRL 7.5 (GLOVE) ×1 IMPLANT
GLOVE BIOGEL PI INDICATOR 7.5 (GLOVE) ×2
GOWN STRL REUS W/ TWL LRG LVL3 (GOWN DISPOSABLE) ×3 IMPLANT
GOWN STRL REUS W/TWL LRG LVL3 (GOWN DISPOSABLE) ×6
GUIDEPIN 3.2X17.5 THRD DISP (PIN) ×3 IMPLANT
GUIDEWIRE 2.6X80 BEAD TIP (WIRE) ×1 IMPLANT
GUIDWIRE 2.6X80 BEAD TIP (WIRE) ×3
K-WIRE 2.0 (WIRE) ×2
K-WIRE FXSTD 280X2XNS SS (WIRE) ×1
KIT BASIN OR (CUSTOM PROCEDURE TRAY) ×3 IMPLANT
KIT TURNOVER KIT B (KITS) ×3 IMPLANT
KWIRE FXSTD 280X2XNS SS (WIRE) ×1 IMPLANT
NAIL TIBIAL PHOENIX 10.5X370MM (Nail) ×3 IMPLANT
NS IRRIG 1000ML POUR BTL (IV SOLUTION) ×3 IMPLANT
PACK TOTAL JOINT (CUSTOM PROCEDURE TRAY) ×3 IMPLANT
PAD ARMBOARD 7.5X6 YLW CONV (MISCELLANEOUS) ×6 IMPLANT
PAD CAST 4YDX4 CTTN HI CHSV (CAST SUPPLIES) ×1 IMPLANT
PADDING CAST COTTON 4X4 STRL (CAST SUPPLIES) ×2
PADDING CAST COTTON 6X4 STRL (CAST SUPPLIES) ×3 IMPLANT
PLATE DISTAL FEMUR 18H 355MM (Plate) ×3 IMPLANT
SCREW 5.0 70MM (Screw) ×3 IMPLANT
SCREW 5.0 80MM (Screw) ×3 IMPLANT
SCREW CORT TI DBL LEAD 5X38 (Screw) ×3 IMPLANT
SCREW CORT TI DBL LEAD 5X42 (Screw) ×3 IMPLANT
SCREW CORT TI DBL LEAD 5X46 (Screw) ×3 IMPLANT
SCREW CORT TI DBL LEAD 5X50 (Screw) ×3 IMPLANT
SCREW CORT TI DBL LEAD 5X56 (Screw) ×3 IMPLANT
SCREW CORT TI DBL LEAD 5X75 (Screw) ×3 IMPLANT
SCREW CORT TI DBL LEAD 5X80 (Screw) ×6 IMPLANT
SCREW CORTICAL 5.0X95MM (Screw) ×3 IMPLANT
SCREW CORTICAL NCB 5.0X40 (Screw) ×3 IMPLANT
SCREW CORTICAL NCB 5.0X90MM (Screw) ×6 IMPLANT
SCREW DIST FEM NCB P-64 (Screw) ×6 IMPLANT
SCREW NCB 4.0MX38M (Screw) ×3 IMPLANT
SCREW NCB 5.0X36MM (Screw) ×3 IMPLANT
SCREW NCB 5.0X38 (Screw) ×6 IMPLANT
SPONGE LAP 18X18 RF (DISPOSABLE) ×9 IMPLANT
SPONGE LAP 18X18 X RAY DECT (DISPOSABLE) IMPLANT
STAPLER VISISTAT 35W (STAPLE) ×3 IMPLANT
SUCTION FRAZIER HANDLE 10FR (MISCELLANEOUS) ×2
SUCTION TUBE FRAZIER 10FR DISP (MISCELLANEOUS) ×1 IMPLANT
SUT ETHILON 2 0 FS 18 (SUTURE) ×24 IMPLANT
SUT ETHILON 3 0 PS 1 (SUTURE) ×9 IMPLANT
SUT MNCRL AB 3-0 PS2 18 (SUTURE) ×3 IMPLANT
SUT MON AB 2-0 CT1 36 (SUTURE) ×9 IMPLANT
SUT PDS AB 0 CT 36 (SUTURE) ×3 IMPLANT
SUT VIC AB 0 CT1 27 (SUTURE)
SUT VIC AB 0 CT1 27XBRD ANBCTR (SUTURE) IMPLANT
SUT VIC AB 1 CT1 27 (SUTURE)
SUT VIC AB 1 CT1 27XBRD ANBCTR (SUTURE) IMPLANT
SUT VIC AB 2-0 CT1 27 (SUTURE) ×4
SUT VIC AB 2-0 CT1 TAPERPNT 27 (SUTURE) ×2 IMPLANT
TOWEL OR 17X24 6PK STRL BLUE (TOWEL DISPOSABLE) ×3 IMPLANT
TOWEL OR 17X26 10 PK STRL BLUE (TOWEL DISPOSABLE) ×6 IMPLANT
TRAY FOLEY MTR SLVR 16FR STAT (SET/KITS/TRAYS/PACK) IMPLANT
WATER STERILE IRR 1000ML POUR (IV SOLUTION) ×6 IMPLANT
YANKAUER SUCT BULB TIP NO VENT (SUCTIONS) ×3 IMPLANT

## 2018-01-19 NOTE — Progress Notes (Signed)
Sputum culture collected and sent to the lab. 
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
## 2018-01-19 NOTE — Anesthesia Procedure Notes (Signed)
Date/Time: 01/19/2018 7:40 AM Performed by: Fransisca Kaufmann, CRNA Pre-anesthesia Checklist: Patient identified, Emergency Drugs available, Suction available and Patient being monitored Patient Re-evaluated:Patient Re-evaluated prior to induction Oxygen Delivery Method: Circle System Utilized Induction Type: Inhalational induction with existing ETT Tube type: Subglottic suction tube Tube size: 7.5 mm Airway Equipment and Method: Oral airway Placement Confirmation: ETT inserted through vocal cords under direct vision,  positive ETCO2 and breath sounds checked- equal and bilateral Tube secured with: Tape Dental Injury: Teeth and Oropharynx as per pre-operative assessment  Comments: ETT remains as pre-op/not manipulated.attached immediately to anesthesia circuit from ambu upon arrival to OR 6

## 2018-01-19 NOTE — Anesthesia Postprocedure Evaluation (Signed)
Anesthesia Post Note  Patient: Wesley Sandoval  Procedure(s) Performed: OPEN REDUCTION INTERNAL FIXATION (ORIF) DISTAL FEMUR FRACTURE (Left ) INTRAMEDULLARY (IM) NAIL TIBIAL (Left )     Patient location during evaluation: PACU Anesthesia Type: General Level of consciousness: awake and alert Pain management: pain level controlled Vital Signs Assessment: post-procedure vital signs reviewed and stable Respiratory status: spontaneous breathing, nonlabored ventilation, respiratory function stable and patient connected to nasal cannula oxygen Cardiovascular status: blood pressure returned to baseline and stable Postop Assessment: no apparent nausea or vomiting Anesthetic complications: no    Last Vitals:  Vitals:   01/19/18 1327 01/19/18 1400  BP:  (!) 145/90  Pulse:  (!) 115  Resp:  19  Temp:    SpO2: 94% 96%    Last Pain:  Vitals:   01/19/18 0400  TempSrc: Axillary  PainSc:                  Phillips Grout
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
## 2018-01-19 NOTE — Anesthesia Preprocedure Evaluation (Signed)
Anesthesia Evaluation  Patient identified by MRN, date of birth, ID bandGeneral Assessment Comment:Intubated and sedated  Reviewed: Allergy & Precautions, NPO status , Patient's Chart, lab work & pertinent test results, Unable to perform ROS - Chart review only  Airway Mallampati: Intubated       Dental no notable dental hx.    Pulmonary neg pulmonary ROS,    Pulmonary exam normal        Cardiovascular negative cardio ROS Normal cardiovascular exam     Neuro/Psych negative neurological ROS  negative psych ROS   GI/Hepatic negative GI ROS, Neg liver ROS,   Endo/Other  negative endocrine ROS  Renal/GU negative Renal ROS  negative genitourinary   Musculoskeletal negative musculoskeletal ROS (+)   Abdominal Normal abdominal exam  (+)   Peds negative pediatric ROS (+)  Hematology  (+) anemia ,   Anesthesia Other Findings   Reproductive/Obstetrics negative OB ROS                             Anesthesia Physical  Anesthesia Plan  ASA: III  Anesthesia Plan: General   Post-op Pain Management:    Induction:   PONV Risk Score and Plan: 2 and Treatment may vary due to age or medical condition  Airway Management Planned: Oral ETT  Additional Equipment:   Intra-op Plan:   Post-operative Plan: Extubation in OR, Post-operative intubation/ventilation and Possible Post-op intubation/ventilation  Informed Consent: I have reviewed the patients History and Physical, chart, labs and discussed the procedure including the risks, benefits and alternatives for the proposed anesthesia with the patient or authorized representative who has indicated his/her understanding and acceptance.     Plan Discussed with:   Anesthesia Plan Comments: (Existing ETT, ventilated, chest tube present)        Anesthesia Quick Evaluation

## 2018-01-19 NOTE — Transfer of Care (Signed)
Immediate Anesthesia Transfer of Care Note  Patient: Wesley Sandoval  Procedure(s) Performed: OPEN REDUCTION INTERNAL FIXATION (ORIF) DISTAL FEMUR FRACTURE (Left ) INTRAMEDULLARY (IM) NAIL TIBIAL (Left )  Patient Location: NICU  Anesthesia Type:General  Level of Consciousness: sedated and Patient remains intubated per anesthesia plan  Airway & Oxygen Therapy: Patient remains intubated per anesthesia plan and Patient placed on Ventilator (see vital sign flow sheet for setting)  Post-op Assessment: Report given to RN and Post -op Vital signs reviewed and stable  Post vital signs: Reviewed and stable  Last Vitals:  Vitals Value Taken Time  BP    Temp    Pulse 107 01/19/2018  1:27 PM  Resp 16 01/19/2018  1:27 PM  SpO2 94 % 01/19/2018  1:27 PM  Vitals shown include unvalidated device data.  Last Pain:  Vitals:   01/19/18 0400  TempSrc: Axillary  PainSc:          Complications: No apparent anesthesia complications

## 2018-01-19 NOTE — Progress Notes (Signed)
Orthopaedic Trauma Progress Note  Stable, Hgb 7.8 this AM.   Plan to proceed with ORIF left femur and intramedullary nailing of left tibia. Leg marked, consent obtained from wife. Type and cross two more units and plan to transfuse in anticipation of blood loss from surgery.  Roby Lofts, MD [facility] [phone] (phone)

## 2018-01-20 ENCOUNTER — Inpatient Hospital Stay (HOSPITAL_COMMUNITY): Payer: BLUE CROSS/BLUE SHIELD

## 2018-01-20 LAB — CBC WITH DIFFERENTIAL/PLATELET
Abs Immature Granulocytes: 0.33 10*3/uL — ABNORMAL HIGH (ref 0.00–0.07)
BASOS ABS: 0 10*3/uL (ref 0.0–0.1)
BASOS PCT: 0 %
Eosinophils Absolute: 0.4 10*3/uL (ref 0.0–0.5)
Eosinophils Relative: 4 %
HCT: 32.1 % — ABNORMAL LOW (ref 39.0–52.0)
Hemoglobin: 9.8 g/dL — ABNORMAL LOW (ref 13.0–17.0)
Immature Granulocytes: 3 %
LYMPHS ABS: 1.2 10*3/uL (ref 0.7–4.0)
Lymphocytes Relative: 10 %
MCH: 27.8 pg (ref 26.0–34.0)
MCHC: 30.5 g/dL (ref 30.0–36.0)
MCV: 91.2 fL (ref 80.0–100.0)
Monocytes Absolute: 0.9 10*3/uL (ref 0.1–1.0)
Monocytes Relative: 8 %
NEUTROS ABS: 8.7 10*3/uL — AB (ref 1.7–7.7)
NEUTROS PCT: 75 %
NRBC: 0.3 % — AB (ref 0.0–0.2)
Platelets: 273 10*3/uL (ref 150–400)
RBC: 3.52 MIL/uL — ABNORMAL LOW (ref 4.22–5.81)
RDW: 15.4 % (ref 11.5–15.5)
WBC: 11.5 10*3/uL — ABNORMAL HIGH (ref 4.0–10.5)

## 2018-01-20 LAB — TYPE AND SCREEN
ABO/RH(D): AB POS
Antibody Screen: NEGATIVE
Unit division: 0
Unit division: 0
Unit division: 0
Unit division: 0

## 2018-01-20 LAB — BASIC METABOLIC PANEL
Anion gap: 5 (ref 5–15)
BUN: 18 mg/dL (ref 6–20)
CALCIUM: 7.6 mg/dL — AB (ref 8.9–10.3)
CO2: 24 mmol/L (ref 22–32)
CREATININE: 0.9 mg/dL (ref 0.61–1.24)
Chloride: 115 mmol/L — ABNORMAL HIGH (ref 98–111)
GFR calc Af Amer: >60 mL/min (ref >60)
GFR calc non Af Amer: >60 mL/min (ref >60)
Glucose, Bld: 156 mg/dL — ABNORMAL HIGH (ref 70–99)
Potassium: 3.5 mmol/L (ref 3.5–5.1)
Sodium: 144 mmol/L (ref 135–145)

## 2018-01-20 LAB — BPAM RBC
BLOOD PRODUCT EXPIRATION DATE: 201911162359
BLOOD PRODUCT EXPIRATION DATE: 201911162359
Blood Product Expiration Date: 201911122359
Blood Product Expiration Date: 201911172359
ISSUE DATE / TIME: 201910301111
ISSUE DATE / TIME: 201910301111
ISSUE DATE / TIME: 201911010739
ISSUE DATE / TIME: 201911010739
UNIT TYPE AND RH: 8400
UNIT TYPE AND RH: 8400
UNIT TYPE AND RH: 8400
Unit Type and Rh: 8400

## 2018-01-20 LAB — GLUCOSE, CAPILLARY
GLUCOSE-CAPILLARY: 120 mg/dL — AB (ref 70–99)
GLUCOSE-CAPILLARY: 124 mg/dL — AB (ref 70–99)
Glucose-Capillary: 131 mg/dL — ABNORMAL HIGH (ref 70–99)
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
## 2018-01-21 ENCOUNTER — Inpatient Hospital Stay (HOSPITAL_COMMUNITY): Payer: BLUE CROSS/BLUE SHIELD

## 2018-01-21 LAB — BASIC METABOLIC PANEL
Anion gap: 4 — ABNORMAL LOW (ref 5–15)
BUN: 19 mg/dL (ref 6–20)
CHLORIDE: 114 mmol/L — AB (ref 98–111)
CO2: 24 mmol/L (ref 22–32)
Calcium: 7.7 mg/dL — ABNORMAL LOW (ref 8.9–10.3)
Creatinine, Ser: 0.86 mg/dL (ref 0.61–1.24)
GFR calc non Af Amer: >60 mL/min (ref >60)
Glucose, Bld: 144 mg/dL — ABNORMAL HIGH (ref 70–99)
Potassium: 3.5 mmol/L (ref 3.5–5.1)
SODIUM: 142 mmol/L (ref 135–145)

## 2018-01-21 LAB — CBC WITH DIFFERENTIAL/PLATELET
Abs Immature Granulocytes: 0.53 10*3/uL — ABNORMAL HIGH (ref 0.00–0.07)
BASOS ABS: 0 10*3/uL (ref 0.0–0.1)
Basophils Relative: 0 %
EOS ABS: 0.5 10*3/uL (ref 0.0–0.5)
Eosinophils Relative: 4 %
HEMATOCRIT: 29.4 % — AB (ref 39.0–52.0)
Hemoglobin: 8.8 g/dL — ABNORMAL LOW (ref 13.0–17.0)
IMMATURE GRANULOCYTES: 4 %
LYMPHS ABS: 1.5 10*3/uL (ref 0.7–4.0)
LYMPHS PCT: 12 %
MCH: 28.3 pg (ref 26.0–34.0)
MCHC: 29.9 g/dL — ABNORMAL LOW (ref 30.0–36.0)
MCV: 94.5 fL (ref 80.0–100.0)
Monocytes Absolute: 0.9 10*3/uL (ref 0.1–1.0)
Monocytes Relative: 8 %
NRBC: 0.2 % (ref 0.0–0.2)
Neutro Abs: 8.6 10*3/uL — ABNORMAL HIGH (ref 1.7–7.7)
Neutrophils Relative %: 72 %
Platelets: 333 10*3/uL (ref 150–400)
RBC: 3.11 MIL/uL — ABNORMAL LOW (ref 4.22–5.81)
RDW: 15.6 % — AB (ref 11.5–15.5)
WBC: 12 10*3/uL — ABNORMAL HIGH (ref 4.0–10.5)

## 2018-01-21 LAB — URINE CULTURE: Culture: NO GROWTH

## 2018-01-21 LAB — GLUCOSE, CAPILLARY
GLUCOSE-CAPILLARY: 100 mg/dL — AB (ref 70–99)
GLUCOSE-CAPILLARY: 134 mg/dL — AB (ref 70–99)
GLUCOSE-CAPILLARY: 112 mg/dL — AB (ref 70–99)
Glucose-Capillary: 112 mg/dL — ABNORMAL HIGH (ref 70–99)
Glucose-Capillary: 117 mg/dL — ABNORMAL HIGH (ref 70–99)
Glucose-Capillary: 124 mg/dL — ABNORMAL HIGH (ref 70–99)

## 2018-01-21 LAB — TRIGLYCERIDES: TRIGLYCERIDES: 112 mg/dL (ref <150)

## 2018-01-21 MED ORDER — LOPERAMIDE HCL 2 MG PO CAPS
2.0000 mg | ORAL_CAPSULE | ORAL | Status: DC | PRN
  Administered 2018-01-22 – 2018-01-24 (×2): 2 mg via ORAL
  Filled 2018-01-21 (×3): qty 1

## 2018-01-21 MED ORDER — KCL IN DEXTROSE-NACL 20-5-0.45 MEQ/L-%-% IV SOLN
INTRAVENOUS | Status: DC
  Administered 2018-01-21 – 2018-01-22 (×2): via INTRAVENOUS
  Filled 2018-01-21 (×3): qty 1000

## 2018-01-21 MED ORDER — QUETIAPINE FUMARATE 25 MG PO TABS
50.0000 mg | ORAL_TABLET | Freq: Three times a day (TID) | ORAL | Status: DC
  Administered 2018-01-21 – 2018-01-22 (×3): 50 mg via ORAL
  Filled 2018-01-21 (×3): qty 2

## 2018-01-21 MED ORDER — ACETAMINOPHEN 160 MG/5ML PO SOLN
1000.0000 mg | Freq: Four times a day (QID) | ORAL | Status: DC
  Administered 2018-01-21 – 2018-01-24 (×11): 1000 mg via ORAL
  Filled 2018-01-21 (×11): qty 40.6

## 2018-01-21 MED ORDER — KETOROLAC TROMETHAMINE 15 MG/ML IJ SOLN
15.0000 mg | Freq: Three times a day (TID) | INTRAMUSCULAR | Status: DC | PRN
  Administered 2018-01-22 – 2018-01-25 (×3): 15 mg via INTRAVENOUS
  Filled 2018-01-21 (×4): qty 1

## 2018-01-21 MED ORDER — OXYCODONE HCL 5 MG PO TABS
10.0000 mg | ORAL_TABLET | ORAL | Status: DC | PRN
  Administered 2018-01-21 – 2018-01-24 (×7): 10 mg via ORAL
  Filled 2018-01-21 (×8): qty 2

## 2018-01-21 MED ORDER — METOPROLOL TARTRATE 25 MG/10 ML ORAL SUSPENSION
25.0000 mg | Freq: Two times a day (BID) | ORAL | Status: DC
  Administered 2018-01-21 – 2018-01-23 (×4): 25 mg via ORAL
  Filled 2018-01-21 (×4): qty 10

## 2018-01-21 MED ORDER — HYDROMORPHONE HCL 1 MG/ML IJ SOLN
1.0000 mg | INTRAMUSCULAR | Status: DC | PRN
  Administered 2018-01-21 – 2018-01-23 (×16): 1 mg via INTRAVENOUS
  Filled 2018-01-21 (×16): qty 1

## 2018-01-21 MED ORDER — CLONAZEPAM 0.5 MG PO TABS
0.5000 mg | ORAL_TABLET | Freq: Three times a day (TID) | ORAL | Status: DC
  Administered 2018-01-21 – 2018-01-22 (×3): 0.5 mg via ORAL
  Filled 2018-01-21 (×3): qty 1

## 2018-01-21 NOTE — Procedures (Signed)
Extubation Procedure Note  Patient Details:   Name: Wesley Sandoval DOB: 11/10/1985 MRN: [id number]   Airway Documentation:    Vent end date: 01/21/18 Vent end time: 1056   Evaluation  O2 sats: stable throughout Complications: No apparent complications Patient did tolerate procedure well. Bilateral Breath Sounds: Clear, Diminished   Yes: patient was able to breathe around the ETT prior to extubation and speak post extubation.   Nell Range 01/21/2018, 10:57 AM
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
## 2018-01-22 ENCOUNTER — Encounter (HOSPITAL_COMMUNITY): Payer: Self-pay | Admitting: Student

## 2018-01-22 LAB — CULTURE, RESPIRATORY W GRAM STAIN

## 2018-01-22 LAB — BASIC METABOLIC PANEL
Anion gap: 5 (ref 5–15)
BUN: 13 mg/dL (ref 6–20)
CO2: 24 mmol/L (ref 22–32)
CREATININE: 0.87 mg/dL (ref 0.61–1.24)
Calcium: 7.9 mg/dL — ABNORMAL LOW (ref 8.9–10.3)
Chloride: 108 mmol/L (ref 98–111)
GFR calc Af Amer: >60 mL/min (ref >60)
GLUCOSE: 120 mg/dL — AB (ref 70–99)
Potassium: 3.5 mmol/L (ref 3.5–5.1)
SODIUM: 137 mmol/L (ref 135–145)

## 2018-01-22 LAB — GLUCOSE, CAPILLARY
Glucose-Capillary: 117 mg/dL — ABNORMAL HIGH (ref 70–99)
Glucose-Capillary: 145 mg/dL — ABNORMAL HIGH (ref 70–99)
Glucose-Capillary: 119 mg/dL — ABNORMAL HIGH (ref 70–99)
Glucose-Capillary: 130 mg/dL — ABNORMAL HIGH (ref 70–99)

## 2018-01-22 LAB — CBC
HCT: 29.2 % — ABNORMAL LOW (ref 39.0–52.0)
Hemoglobin: 9.3 g/dL — ABNORMAL LOW (ref 13.0–17.0)
MCH: 29 pg (ref 26.0–34.0)
MCHC: 31.8 g/dL (ref 30.0–36.0)
MCV: 91 fL (ref 80.0–100.0)
PLATELETS: 428 10*3/uL — AB (ref 150–400)
RBC: 3.21 MIL/uL — AB (ref 4.22–5.81)
RDW: 14.9 % (ref 11.5–15.5)
WBC: 13.1 10*3/uL — ABNORMAL HIGH (ref 4.0–10.5)
nRBC: 0 % (ref 0.0–0.2)

## 2018-01-22 LAB — CULTURE, RESPIRATORY

## 2018-01-22 MED ORDER — SODIUM CHLORIDE 0.9 % IV SOLN
1.0000 g | INTRAVENOUS | Status: DC
  Filled 2018-01-22: qty 1

## 2018-01-22 MED ORDER — METHOCARBAMOL 1000 MG/10ML IJ SOLN
1000.0000 mg | Freq: Three times a day (TID) | INTRAVENOUS | Status: DC | PRN
  Administered 2018-01-23 (×2): 1000 mg via INTRAVENOUS
  Filled 2018-01-22 (×3): qty 10

## 2018-01-22 MED ORDER — ADULT MULTIVITAMIN W/MINERALS CH
1.0000 | ORAL_TABLET | Freq: Every day | ORAL | Status: DC
  Administered 2018-01-23 – 2018-01-25 (×3): 1 via ORAL
  Filled 2018-01-22 (×3): qty 1

## 2018-01-22 MED ORDER — SODIUM CHLORIDE 0.9 % IV SOLN
1.0000 g | Freq: Once | INTRAVENOUS | Status: AC
  Administered 2018-01-22: 1 g via INTRAVENOUS
  Filled 2018-01-22: qty 1

## 2018-01-22 MED ORDER — ENSURE ENLIVE PO LIQD
237.0000 mL | Freq: Three times a day (TID) | ORAL | Status: DC
  Administered 2018-01-22 – 2018-01-25 (×7): 237 mL via ORAL

## 2018-01-22 MED ORDER — SODIUM CHLORIDE 0.9 % IV SOLN
1.0000 g | Freq: Three times a day (TID) | INTRAVENOUS | Status: DC
  Administered 2018-01-22 – 2018-01-25 (×9): 1 g via INTRAVENOUS
  Filled 2018-01-22 (×11): qty 1

## 2018-01-22 NOTE — Progress Notes (Signed)
Pt transferred to 4 No P room 10 at about 0900. Report given to [facility]. Bedside handoff with Tamera. Pt's cousins at bedside.
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
## 2018-01-22 NOTE — Progress Notes (Signed)
PHARMACY NOTE -  ANTIBIOTIC RENAL DOSE ADJUSTMENT    Patient has been initiated on cefepime for HCAP. SCr 0.87, estimated CrCl >100 ml/min Current dosage ordered cefepime 1g IV Q24H. Will change to Q8H.  Vernard Gambles, PharmD, BCPS 01/22/2018 8:31 AM
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
## 2018-01-23 ENCOUNTER — Inpatient Hospital Stay (HOSPITAL_COMMUNITY): Payer: BLUE CROSS/BLUE SHIELD

## 2018-01-23 LAB — GLUCOSE, CAPILLARY
GLUCOSE-CAPILLARY: 119 mg/dL — AB (ref 70–99)
GLUCOSE-CAPILLARY: 123 mg/dL — AB (ref 70–99)
GLUCOSE-CAPILLARY: 127 mg/dL — AB (ref 70–99)
Glucose-Capillary: 109 mg/dL — ABNORMAL HIGH (ref 70–99)

## 2018-01-23 MED ORDER — FAMOTIDINE 20 MG PO TABS
20.0000 mg | ORAL_TABLET | Freq: Two times a day (BID) | ORAL | Status: DC
  Administered 2018-01-23 – 2018-01-25 (×4): 20 mg via ORAL
  Filled 2018-01-23 (×4): qty 1

## 2018-01-23 MED ORDER — METOPROLOL TARTRATE 25 MG PO TABS
25.0000 mg | ORAL_TABLET | Freq: Two times a day (BID) | ORAL | Status: DC
  Administered 2018-01-23 – 2018-01-25 (×4): 25 mg via ORAL
  Filled 2018-01-23 (×4): qty 1

## 2018-01-23 MED ORDER — POLYETHYLENE GLYCOL 3350 17 G PO PACK
17.0000 g | PACK | Freq: Every day | ORAL | Status: DC
  Administered 2018-01-23: 17 g via ORAL
  Filled 2018-01-23: qty 1
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
## 2018-01-23 NOTE — Progress Notes (Signed)
Orthopedic Tech Progress Note Patient Details:  Wesley Sandoval 03-26-1985 [id number]  Ortho Devices Type of Ortho Device: Prafo boot/shoe Ortho Device/Splint Location: lle Ortho Device/Splint Interventions: Application   Post Interventions Patient Tolerated: Well Instructions Provided: Care of device   Nikki Dom 01/23/2018, 5:37 PM
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
## 2018-01-23 NOTE — Progress Notes (Signed)
Orthopedic Tech Progress Note Patient Details:  Wesley Sandoval 1985/08/12 [id number]  Patient ID: Wesley Sandoval, male   DOB: Feb 27, 1986, 32 y.o.   MRN: [id number]   Nikki Dom 01/23/2018, 11:08 AM Called in [employer] brace order; spoke with [facility]

## 2018-01-23 NOTE — Progress Notes (Signed)
Orthopedic Tech Progress Note Patient Details:  SHRIYAN ARAKAWA 16-Oct-1985 [id number]  Patient ID: Leanne Lovely, male   DOB: [birth year], 32 y.o.   MRN: [id number]   Nikki Dom 01/23/2018, 9:06 AM Called in [employer] brace order; spoke with Wylene Men

## 2018-01-23 NOTE — Progress Notes (Signed)
   01/23/18 1200  Clinical Encounter Type  Visited With Patient and family together  Visit Type Initial  Referral From Other (Comment) (family member met me on hallway and requested visit with PT.)  Consult/Referral To Chaplain  Spiritual Encounters  Spiritual Needs Emotional  Stress Factors  Patient Stress Factors Exhausted;Health changes  Family Stress Factors Family relationships   I was invited to visit PT by family member on hallway. PT was alert and wife was at bedside. I offered spiritual care with words of encouragement and ministry of presence. Chaplain available as needed.   Chaplain Fidel Levy  [phone]
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
## 2018-01-24 ENCOUNTER — Encounter (HOSPITAL_COMMUNITY): Payer: Self-pay | Admitting: Physical Medicine and Rehabilitation

## 2018-01-24 DIAGNOSIS — D62 Acute posthemorrhagic anemia: Secondary | ICD-10-CM

## 2018-01-24 DIAGNOSIS — S2243XA Multiple fractures of ribs, bilateral, initial encounter for closed fracture: Secondary | ICD-10-CM

## 2018-01-24 DIAGNOSIS — S299XXA Unspecified injury of thorax, initial encounter: Secondary | ICD-10-CM

## 2018-01-24 DIAGNOSIS — S82401A Unspecified fracture of shaft of right fibula, initial encounter for closed fracture: Secondary | ICD-10-CM

## 2018-01-24 DIAGNOSIS — S82201A Unspecified fracture of shaft of right tibia, initial encounter for closed fracture: Secondary | ICD-10-CM

## 2018-01-24 DIAGNOSIS — F1010 Alcohol abuse, uncomplicated: Secondary | ICD-10-CM

## 2018-01-24 DIAGNOSIS — T148XXA Other injury of unspecified body region, initial encounter: Secondary | ICD-10-CM

## 2018-01-24 DIAGNOSIS — Y95 Nosocomial condition: Secondary | ICD-10-CM

## 2018-01-24 DIAGNOSIS — J189 Pneumonia, unspecified organism: Secondary | ICD-10-CM

## 2018-01-24 DIAGNOSIS — G8918 Other acute postprocedural pain: Secondary | ICD-10-CM

## 2018-01-24 DIAGNOSIS — D72829 Elevated white blood cell count, unspecified: Secondary | ICD-10-CM

## 2018-01-24 LAB — CBC
HEMATOCRIT: 31.3 % — AB (ref 39.0–52.0)
Hemoglobin: 9.7 g/dL — ABNORMAL LOW (ref 13.0–17.0)
MCH: 28 pg (ref 26.0–34.0)
MCHC: 31 g/dL (ref 30.0–36.0)
MCV: 90.2 fL (ref 80.0–100.0)
NRBC: 0.2 % (ref 0.0–0.2)
Platelets: 703 10*3/uL — ABNORMAL HIGH (ref 150–400)
RBC: 3.47 MIL/uL — ABNORMAL LOW (ref 4.22–5.81)
RDW: 14.2 % (ref 11.5–15.5)
WBC: 13.1 10*3/uL — AB (ref 4.0–10.5)

## 2018-01-24 LAB — GLUCOSE, CAPILLARY
GLUCOSE-CAPILLARY: 128 mg/dL — AB (ref 70–99)
Glucose-Capillary: 122 mg/dL — ABNORMAL HIGH (ref 70–99)
Glucose-Capillary: 91 mg/dL (ref 70–99)
Glucose-Capillary: 115 mg/dL — ABNORMAL HIGH (ref 70–99)

## 2018-01-24 LAB — BASIC METABOLIC PANEL
ANION GAP: 6 (ref 5–15)
BUN: 16 mg/dL (ref 6–20)
CALCIUM: 8.6 mg/dL — AB (ref 8.9–10.3)
CO2: 25 mmol/L (ref 22–32)
Chloride: 106 mmol/L (ref 98–111)
Creatinine, Ser: 0.97 mg/dL (ref 0.61–1.24)
GFR calc Af Amer: >60 mL/min (ref >60)
Glucose, Bld: 139 mg/dL — ABNORMAL HIGH (ref 70–99)
Potassium: 3.7 mmol/L (ref 3.5–5.1)
Sodium: 137 mmol/L (ref 135–145)

## 2018-01-24 MED ORDER — ACETAMINOPHEN 325 MG PO TABS
650.0000 mg | ORAL_TABLET | Freq: Four times a day (QID) | ORAL | Status: DC
  Administered 2018-01-24 – 2018-01-25 (×5): 650 mg via ORAL
  Filled 2018-01-24 (×5): qty 2

## 2018-01-24 MED ORDER — METHOCARBAMOL 500 MG PO TABS
500.0000 mg | ORAL_TABLET | Freq: Three times a day (TID) | ORAL | Status: DC
  Administered 2018-01-24 – 2018-01-25 (×5): 500 mg via ORAL
  Filled 2018-01-24 (×4): qty 1

## 2018-01-24 MED ORDER — OXYCODONE HCL 5 MG PO TABS
5.0000 mg | ORAL_TABLET | ORAL | Status: DC | PRN
  Administered 2018-01-24 – 2018-01-25 (×3): 10 mg via ORAL
  Filled 2018-01-24: qty 1
  Filled 2018-01-24: qty 2
  Filled 2018-01-24: qty 1
  Filled 2018-01-24: qty 2

## 2018-01-24 MED ORDER — HYDROMORPHONE HCL 1 MG/ML IJ SOLN
0.5000 mg | INTRAMUSCULAR | Status: DC | PRN
  Administered 2018-01-24: 0.5 mg via INTRAVENOUS
  Filled 2018-01-24: qty 1

## 2018-01-24 NOTE — Plan of Care (Signed)
Patient eagar to get out of bed, NWB bil LE, patient able to transfer from bed to chair/ [facility] with 2 person assist.  Will continue to teach family  present at bedside.
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
## 2018-01-24 NOTE — Progress Notes (Signed)
Patient becomes confused, agitated and non-compliant after administration of Oxycodone. Let's find a different option please.
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
## 2018-01-25 ENCOUNTER — Inpatient Hospital Stay (HOSPITAL_COMMUNITY): Payer: BLUE CROSS/BLUE SHIELD

## 2018-01-25 ENCOUNTER — Encounter (HOSPITAL_COMMUNITY): Payer: Self-pay | Admitting: General Practice

## 2018-01-25 ENCOUNTER — Other Ambulatory Visit: Payer: Self-pay

## 2018-01-25 DIAGNOSIS — M7989 Other specified soft tissue disorders: Secondary | ICD-10-CM

## 2018-01-25 LAB — CBC
HEMATOCRIT: 31.2 % — AB (ref 39.0–52.0)
HEMOGLOBIN: 10 g/dL — AB (ref 13.0–17.0)
MCH: 29 pg (ref 26.0–34.0)
MCHC: 32.1 g/dL (ref 30.0–36.0)
MCV: 90.4 fL (ref 80.0–100.0)
NRBC: 0.2 % (ref 0.0–0.2)
Platelets: 699 10*3/uL — ABNORMAL HIGH (ref 150–400)
RBC: 3.45 MIL/uL — ABNORMAL LOW (ref 4.22–5.81)
RDW: 14.5 % (ref 11.5–15.5)
WBC: 13.3 10*3/uL — AB (ref 4.0–10.5)

## 2018-01-25 LAB — CULTURE, BLOOD (ROUTINE X 2)
Culture: NO GROWTH
Culture: NO GROWTH
Special Requests: ADEQUATE

## 2018-01-25 LAB — GLUCOSE, CAPILLARY
Glucose-Capillary: 112 mg/dL — ABNORMAL HIGH (ref 70–99)
Glucose-Capillary: 113 mg/dL — ABNORMAL HIGH (ref 70–99)

## 2018-01-25 MED ORDER — ENOXAPARIN SODIUM 40 MG/0.4ML ~~LOC~~ SOLN: 40.0000 mg | SUBCUTANEOUS | 0 refills | Status: DC

## 2018-01-25 MED ORDER — POLYETHYLENE GLYCOL 3350 17 G PO PACK: 17.0000 g | PACK | Freq: Every day | ORAL | 0 refills | Status: DC

## 2018-01-25 MED ORDER — ACETAMINOPHEN 325 MG PO TABS: 650.0000 mg | ORAL_TABLET | Freq: Four times a day (QID) | ORAL | Status: DC

## 2018-01-25 MED ORDER — METHOCARBAMOL 500 MG PO TABS: 500.0000 mg | ORAL_TABLET | Freq: Three times a day (TID) | ORAL | 0 refills | Status: DC | PRN

## 2018-01-25 MED ORDER — OXYCODONE HCL 5 MG PO TABS: 5.0000 mg | ORAL_TABLET | Freq: Four times a day (QID) | ORAL | 0 refills | Status: DC | PRN
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
## 2018-01-25 NOTE — Progress Notes (Signed)
Inpatient Rehabilitation-Admissions Coordinator   Noted pt has progressed well with therapies while in house with pt at supervision level for transfers. Pt states he feels comfortable with his ADL performance and will have support at home from family. Pt wishes to return home at this time; Given his current level of function, feel this is appropriate as well.   AC will sign off.   Please call if questions.   Nanine Means, OTR/L  Rehab Admissions Coordinator  [phone] 01/25/2018 1:55 PM

## 2018-01-25 NOTE — Progress Notes (Signed)
LLE venous duplex prelim: negative for DVT. Della Homan Eunice, RDMS, RVT  
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
## 2018-01-25 NOTE — Progress Notes (Signed)
Pt being discharged home via wheelchair with family. Pt alert and oriented x4. VSS. Pt c/o no pain at this time. No signs of respiratory distress. Education complete and care plans resolved. IV removed with catheter intact and pt tolerated well. Dressings rewrapped, CDI. No further issues at this time. Pt to follow up with PCP. Jillyn Hidden, RN
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
## 2018-01-25 NOTE — Discharge Instructions (Signed)
1. PAIN CONTROL:  1. Pain is best controlled by a usual combination of three different methods TOGETHER:  1. Ice/Heat 2. Over the counter pain medication 3. Prescription pain medication 2. Most patients will experience some swelling and bruising around wounds. Ice packs or heating pads (30-60 minutes up to 6 times a day) will help. Use ice for the first few days to help decrease swelling and bruising, then switch to heat to help relax tight/sore spots and speed recovery. Some people prefer to use ice alone, heat alone, alternating between ice & heat. Experiment to what works for you. Swelling and bruising can take several weeks to resolve.  3. It is helpful to take an over-the-counter pain medication regularly for the first few weeks. Choose one of the following that works best for you:  1. Naproxen (Aleve, etc) Two 220mg  tabs twice a day 2. Ibuprofen (Advil, etc) Three 200mg  tabs four times a day (every meal & bedtime) 3. Acetaminophen (Tylenol, etc) 500-650mg  four times a day (every meal & bedtime) 4. A prescription for pain medication (such as oxycodone, hydrocodone, etc) should be given to you upon discharge. Take your pain medication as prescribed.  1. If you are having problems/concerns with the prescription medicine (does not control pain, nausea, vomiting, rash, itching, etc), please call us [phone] to see if we need to switch you to a different pain medicine that will work better for you and/or control your side effect better. 2. If you need a refill on your pain medication, please contact your pharmacy. They will contact our office to request authorization. Prescriptions will not be filled after 5 pm or on week-ends. 4. Avoid getting constipated. When taking pain medications, it is common to experience some constipation. Increasing fluid intake and taking a fiber supplement (such as Metamucil, Citrucel, FiberCon, MiraLax, etc) 1-2 times a day regularly will usually help prevent this  problem from occurring. A mild laxative (prune juice, Milk of Magnesia, MiraLax, etc) should be taken according to package directions if there are no bowel movements after 48 hours.  5. Watch out for diarrhea. If you have many loose bowel movements, simplify your diet to bland foods & liquids for a few days. Stop any stool softeners and decrease your fiber supplement. Switching to mild anti-diarrheal medications (Kayopectate, Pepto Bismol) can help. If this worsens or does not improve, please call us. 6. Wash / shower every day. You may shower daily and replace your bandges after showering. No bathing or submerging your wounds in water until they heal. 7. FOLLOW UP in our office  Please call [facility] at [phone] to confirm your appointment   WHEN TO CALL us [phone]:  1. Poor pain control 2. Reactions / problems with new medications (rash/itching, nausea, etc)  3. Fever over 101.5 F (38.5 C) 4. Worsening swelling or bruising 5. Continued bleeding from wounds. 6. Increased pain, redness, or drainage from the wounds which could be signs of infection  The clinic staff is available to answer your questions during regular business hours (8:30am-5pm). Please dont hesitate to call and ask to speak to one of our nurses for clinical concerns.  If you have a medical emergency, go to the nearest emergency room or call 911.  A surgeon from [facility] is always on call at the [facility], [place]  [address], [place], [place] [address] ?  MAIN: [phone] ? TOLL FREE: [phone] ?  FAX [phone]  [email or web address]    Enoxaparin injection What is this medicine? ENOXAPARIN (ee nox a PA rin) is used after knee, hip, or abdominal surgeries to prevent blood clotting. It is also used to treat existing blood clots in the lungs or in the veins. This medicine may be used for other purposes; ask your health care provider or  pharmacist if you have questions. COMMON BRAND NAME(S): Lovenox What should I tell my health care provider before I take this medicine? They need to know if you have any of these conditions: -bleeding disorders, hemorrhage, or hemophilia -infection of the heart or heart valves -kidney or liver disease -previous stroke -prosthetic heart valve -recent surgery or delivery of a baby -ulcer in the stomach or intestine, diverticulitis, or other bowel disease -an unusual or allergic reaction to enoxaparin, heparin, pork or pork products, other medicines, foods, dyes, or preservatives -pregnant or trying to get pregnant -breast-feeding How should I use this medicine? This medicine is for injection under the skin. It is usually given by a health-care professional. You or a family member may be trained on how to give the injections. If you are to give yourself injections, make sure you understand how to use the syringe, measure the dose if necessary, and give the injection. To avoid bruising, do not rub the site where this medicine has been injected. Do not take your medicine more often than directed. Do not stop taking except on the advice of your doctor or health care professional. Make sure you receive a puncture-resistant container to dispose of the needles and syringes once you have finished with them. Do not reuse these items. Return the container to your doctor or health care professional for proper disposal. Talk to your pediatrician regarding the use of this medicine in children. Special care may be needed. Overdosage: If you think you have taken too much of this medicine contact a poison control center or emergency room at once. NOTE: This medicine is only for you. Do not share this medicine with others. What if I miss a dose? If you miss a dose, take it as soon as you can. If it is almost time for your next dose, take only that dose. Do not take double or extra doses. What may interact with  this medicine? -aspirin and aspirin-like medicines -certain medicines that treat or prevent blood clots -dipyridamole -NSAIDs, medicines for pain and inflammation, like ibuprofen or naproxen This list may not describe all possible interactions. Give your health care provider a list of all the medicines, herbs, non-prescription drugs, or dietary supplements you use. Also tell them if you smoke, drink alcohol, or use illegal drugs. Some items may interact with your medicine. What should I watch for while using this medicine? Visit your doctor or health care professional for regular checks on your progress. Your condition will be monitored carefully while you are receiving this medicine. Notify your doctor or health care professional and seek emergency treatment if you develop breathing problems; changes in vision; chest pain; severe, sudden headache; pain, swelling, warmth in the leg; trouble speaking; sudden numbness or weakness of the face, arm, or leg. These can be signs that your condition has gotten worse. If you are going to have surgery, tell your doctor or health care professional that you are taking this medicine. Do not stop taking this medicine without first talking to your doctor. Be sure to refill your prescription before you run out of medicine. Avoid sports and activities that might cause injury while you  are using this medicine. Severe falls or injuries can cause unseen bleeding. Be careful when using sharp tools or knives. Consider using an Neurosurgeon. Take special care brushing or flossing your teeth. Report any injuries, bruising, or red spots on the skin to your doctor or health care professional. What side effects may I notice from receiving this medicine? Side effects that you should report to your doctor or health care professional as soon as possible: -allergic reactions like skin rash, itching or hives, swelling of the face, lips, or tongue -feeling faint or lightheaded,  falls -signs and symptoms of bleeding such as bloody or black, tarry stools; red or dark-brown urine; spitting up blood or brown material that looks like coffee grounds; red spots on the skin; unusual bruising or bleeding from the eye, gums, or nose Side effects that usually do not require medical attention (report to your doctor or health care professional if they continue or are bothersome): -pain, redness, or irritation at site where injected This list may not describe all possible side effects. Call your doctor for medical advice about side effects. You may report side effects to FDA at 1-800-FDA-1088. Where should I keep my medicine? Keep out of the reach of children. Store at room temperature between 15 and 30 degrees C (59 and 86 degrees F). Do not freeze. If your injections have been specially prepared, you may need to store them in the refrigerator. Ask your pharmacist. Throw away any unused medicine after the expiration date. NOTE: This sheet is a summary. It may not cover all possible information. If you have questions about this medicine, talk to your doctor, pharmacist, or health care provider.  2018 Elsevier/Gold Standard (2013-07-09 16:06:21)

## 2018-01-25 NOTE — Progress Notes (Signed)
Patient is very eager to go home. States that Dr. Jena Gauss came in before me and told him it was "okay for him to leave today", currently no new orders for patient.  Patient and family educated that each provider/specialty that is seeing patient must medically clear him before discharge.   Patient stated that he has to come back in 5 weeks for another surgery to place a plate and then he will go to CIR for about a week or two.   Since Nurse did patient intake on Monday from ICU patient has had both wound vacs removed, chest tube removed, and flexi seal removed. Patient seems a lot more alert and less flat affect.   Family at bedside.

## 2018-01-27 ENCOUNTER — Encounter: Payer: Self-pay | Admitting: Student

## 2018-02-05 ENCOUNTER — Ambulatory Visit
Admission: RE | Admit: 2018-02-05 | Discharge: 2018-02-05 | Disposition: A | Discharge status: Home / Self Care | Payer: BLUE CROSS/BLUE SHIELD | Source: Ambulatory Visit | Attending: General Surgery | Admitting: General Surgery

## 2018-02-05 ENCOUNTER — Other Ambulatory Visit: Payer: Self-pay | Admitting: General Surgery

## 2018-02-05 DIAGNOSIS — S2249XA Multiple fractures of ribs, unspecified side, initial encounter for closed fracture: Secondary | ICD-10-CM

## 2018-02-27 ENCOUNTER — Ambulatory Visit: Payer: Self-pay | Admitting: Student

## 2018-02-27 DIAGNOSIS — S72352K Displaced comminuted fracture of shaft of left femur, subsequent encounter for closed fracture with nonunion: Secondary | ICD-10-CM

## 2018-03-02 NOTE — Pre-Procedure Instructions (Addendum)
Eulah Pontony Chavis Monarch  03/02/2018      [facility][id number] Dan Humphreys- [place], [place] - [address] AT [facility] [address] [facility] [place] [id number] Phone: [phone] Fax: [phone]  [id number] Cheree Ditto- [place], [place] - [address] AT [facility] OF [address] [facility] [address] [place] [address] Phone: [phone] Fax: [phone]    Your procedure is scheduled on Tues., Dec. 17, 2019 from 7:30AM-10:00AM  [facility] "A" at 5:30AM  Call this number if you have problems the morning of surgery:  [phone]   Remember:  Do not eat or drink after midnight on Dec. 16th   Take these medicines the morning of surgery with A SIP OF WATER: Gabapentin (NEURONTIN)   Follow your surgeon's instructions on when to stop Lovenox.  If no instructions were given by your surgeon then you will need to call the office to get those instructions.    If needed: Acetaminophen (TYLENOL) for mild pain, TraMADol (ULTRAM) for moderate pain, and Methocarbamol (ROBAXIN)   As of today, stop taking all Other Aspirin Products, Vitamins, Fish oils, and Herbal medications. Also stop all NSAIDS i.e. Advil, Ibuprofen, Motrin, Aleve, Anaprox, Naproxen, BC, Goody Powders, and all Supplements.    Do not wear jewelry.  Do not wear lotions, powders, colognes, or deodorant.  Do not shave 48 hours prior to surgery.  Men may shave face.  Do not bring valuables to the hospital.  [facility] is not responsible for any belongings or valuables.  Contacts, dentures or bridgework may not be worn into surgery.  Leave your suitcase in the car.  After surgery it may be brought to your room.  For patients admitted to the hospital, discharge time will be determined by your treatment team.  Patients discharged the day of surgery will not be allowed to drive home.   Special instructions:   [place]- Preparing For Surgery  Before surgery,  you can play an important role. Because skin is not sterile, your skin needs to be as free of germs as possible. You can reduce the number of germs on your skin by washing with CHG (chlorahexidine gluconate) Soap before surgery.  CHG is an antiseptic cleaner which kills germs and bonds with the skin to continue killing germs even after washing.    Oral Hygiene is also important to reduce your risk of infection.  Remember - BRUSH YOUR TEETH THE MORNING OF SURGERY WITH YOUR REGULAR TOOTHPASTE  Please do not use if you have an allergy to CHG or antibacterial soaps. If your skin becomes reddened/irritated stop using the CHG.  Do not shave (including legs and underarms) for at least 48 hours prior to first CHG shower. It is OK to shave your face.  Please follow these instructions carefully.   1. Shower the NIGHT BEFORE SURGERY and the MORNING OF SURGERY with CHG.   2. If you chose to wash your hair, wash your hair first as usual with your normal shampoo.  3. After you shampoo, rinse your hair and body thoroughly to remove the shampoo.  4. Use CHG as you would any other liquid soap. You can apply CHG directly to the skin and wash gently with a scrungie or a clean washcloth.   5. Apply the CHG Soap to your body ONLY FROM THE NECK DOWN.  Do not use on open wounds or open  sores. Avoid contact with your eyes, ears, mouth and genitals (private parts). Wash Face and genitals (private parts)  with your normal soap.  6. Wash thoroughly, paying special attention to the area where your surgery will be performed.  7. Thoroughly rinse your body with warm water from the neck down.  8. DO NOT shower/wash with your normal soap after using and rinsing off the CHG Soap.  9. Pat yourself dry with a CLEAN TOWEL.  10. Wear CLEAN PAJAMAS to bed the night before surgery, wear comfortable clothes the morning of surgery  11. Place CLEAN SHEETS on your bed the night of your first shower and DO NOT SLEEP WITH  PETS.  Day of Surgery:  Do not apply any deodorants/lotions.  Please wear clean clothes to the hospital/surgery center.   Remember to brush your teeth WITH YOUR REGULAR TOOTHPASTE.  Please read over the following fact sheets that you were given. Pain Booklet, Coughing and Deep Breathing and Surgical Site Infection Prevention

## 2018-03-05 ENCOUNTER — Encounter (HOSPITAL_COMMUNITY): Payer: Self-pay

## 2018-03-05 ENCOUNTER — Encounter (HOSPITAL_COMMUNITY)
Admission: RE | Admit: 2018-03-05 | Discharge: 2018-03-05 | Disposition: A | Discharge status: Home / Self Care | Payer: BLUE CROSS/BLUE SHIELD | Source: Ambulatory Visit | Attending: Student | Admitting: Student

## 2018-03-05 DIAGNOSIS — Z01812 Encounter for preprocedural laboratory examination: Secondary | ICD-10-CM | POA: Insufficient documentation

## 2018-03-05 LAB — CBC WITH DIFFERENTIAL/PLATELET
Abs Immature Granulocytes: 0.03 10*3/uL (ref 0.00–0.07)
Basophils Absolute: 0 10*3/uL (ref 0.0–0.1)
Basophils Relative: 1 %
Eosinophils Absolute: 0.2 10*3/uL (ref 0.0–0.5)
Eosinophils Relative: 3 %
HCT: 46.1 % (ref 39.0–52.0)
Hemoglobin: 14.3 g/dL (ref 13.0–17.0)
Immature Granulocytes: 0 %
Lymphocytes Relative: 25 %
Lymphs Abs: 1.8 10*3/uL (ref 0.7–4.0)
MCH: 28.3 pg (ref 26.0–34.0)
MCHC: 31 g/dL (ref 30.0–36.0)
MCV: 91.3 fL (ref 80.0–100.0)
Monocytes Absolute: 0.6 10*3/uL (ref 0.1–1.0)
Monocytes Relative: 8 %
NEUTROS ABS: 4.6 10*3/uL (ref 1.7–7.7)
Neutrophils Relative %: 63 %
Platelets: 341 10*3/uL (ref 150–400)
RBC: 5.05 MIL/uL (ref 4.22–5.81)
RDW: 13.2 % (ref 11.5–15.5)
WBC: 7.2 10*3/uL (ref 4.0–10.5)
nRBC: 0 % (ref 0.0–0.2)

## 2018-03-05 LAB — BASIC METABOLIC PANEL
ANION GAP: 12 (ref 5–15)
BUN: 9 mg/dL (ref 6–20)
CO2: 23 mmol/L (ref 22–32)
Calcium: 9.9 mg/dL (ref 8.9–10.3)
Chloride: 101 mmol/L (ref 98–111)
Creatinine, Ser: 0.91 mg/dL (ref 0.61–1.24)
GFR calc Af Amer: >60 mL/min (ref >60)
GFR calc non Af Amer: >60 mL/min (ref >60)
Glucose, Bld: 104 mg/dL — ABNORMAL HIGH (ref 70–99)
POTASSIUM: 4.2 mmol/L (ref 3.5–5.1)
Sodium: 136 mmol/L (ref 135–145)

## 2018-03-05 LAB — PROTIME-INR
INR: 0.92
Prothrombin Time: 12.3 seconds (ref 11.4–15.2)
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
## 2018-03-06 ENCOUNTER — Other Ambulatory Visit: Payer: Self-pay

## 2018-03-06 ENCOUNTER — Inpatient Hospital Stay (HOSPITAL_COMMUNITY): Payer: BLUE CROSS/BLUE SHIELD

## 2018-03-06 ENCOUNTER — Encounter (HOSPITAL_COMMUNITY): Payer: Self-pay

## 2018-03-06 ENCOUNTER — Inpatient Hospital Stay (HOSPITAL_COMMUNITY): Payer: BLUE CROSS/BLUE SHIELD | Admitting: Anesthesiology

## 2018-03-06 ENCOUNTER — Encounter (HOSPITAL_COMMUNITY)
Admission: RE | Disposition: A | Discharge status: Home Health | Payer: Self-pay | Source: Home / Self Care | Attending: Student

## 2018-03-06 ENCOUNTER — Inpatient Hospital Stay (HOSPITAL_COMMUNITY)
Admission: RE | Admit: 2018-03-06 | Discharge: 2018-03-09 | DRG: 482 | Disposition: A | Discharge status: Home Health | Payer: BLUE CROSS/BLUE SHIELD | Attending: Student | Admitting: Student

## 2018-03-06 DIAGNOSIS — Z8701 Personal history of pneumonia (recurrent): Secondary | ICD-10-CM

## 2018-03-06 DIAGNOSIS — X58XXXD Exposure to other specified factors, subsequent encounter: Secondary | ICD-10-CM | POA: Diagnosis present

## 2018-03-06 DIAGNOSIS — T1490XA Injury, unspecified, initial encounter: Secondary | ICD-10-CM | POA: Diagnosis not present

## 2018-03-06 DIAGNOSIS — Z87891 Personal history of nicotine dependence: Secondary | ICD-10-CM | POA: Diagnosis not present

## 2018-03-06 DIAGNOSIS — Z79899 Other long term (current) drug therapy: Secondary | ICD-10-CM | POA: Diagnosis not present

## 2018-03-06 DIAGNOSIS — G8918 Other acute postprocedural pain: Secondary | ICD-10-CM | POA: Diagnosis not present

## 2018-03-06 DIAGNOSIS — S7292XB Unspecified fracture of left femur, initial encounter for open fracture type I or II: Secondary | ICD-10-CM | POA: Diagnosis present

## 2018-03-06 DIAGNOSIS — S7292XN Unspecified fracture of left femur, subsequent encounter for open fracture type IIIA, IIIB, or IIIC with nonunion: Secondary | ICD-10-CM | POA: Diagnosis present

## 2018-03-06 DIAGNOSIS — R000 Tachycardia, unspecified: Secondary | ICD-10-CM

## 2018-03-06 DIAGNOSIS — S72402N Unspecified fracture of lower end of left femur, subsequent encounter for open fracture type IIIA, IIIB, or IIIC with nonunion: Principal | ICD-10-CM

## 2018-03-06 DIAGNOSIS — T148XXA Other injury of unspecified body region, initial encounter: Secondary | ICD-10-CM

## 2018-03-06 DIAGNOSIS — S72352K Displaced comminuted fracture of shaft of left femur, subsequent encounter for closed fracture with nonunion: Secondary | ICD-10-CM | POA: Diagnosis not present

## 2018-03-06 HISTORY — PX: ORIF FEMUR FRACTURE: SHX2119

## 2018-03-06 HISTORY — DX: Other specified postprocedural states: Z98.890

## 2018-03-06 HISTORY — DX: Other specified postprocedural states: R11.2

## 2018-03-06 HISTORY — DX: Unspecified fracture of left femur, initial encounter for closed fracture: S72.92XA

## 2018-03-06 SURGERY — OPEN REDUCTION INTERNAL FIXATION FEMORAL SHAFT FRACTURE
Anesthesia: General | Site: Leg Upper | Laterality: Bilateral

## 2018-03-06 MED ORDER — CEFAZOLIN SODIUM-DEXTROSE 2-4 GM/100ML-% IV SOLN
2.0000 g | INTRAVENOUS | Status: AC
  Administered 2018-03-06 (×2): 2 g via INTRAVENOUS

## 2018-03-06 MED ORDER — ROCURONIUM BROMIDE 50 MG/5ML IV SOSY
PREFILLED_SYRINGE | INTRAVENOUS | Status: AC
  Filled 2018-03-06: qty 15

## 2018-03-06 MED ORDER — ACETAMINOPHEN 10 MG/ML IV SOLN
INTRAVENOUS | Status: AC
  Filled 2018-03-06: qty 100

## 2018-03-06 MED ORDER — ONDANSETRON HCL 4 MG/2ML IJ SOLN
INTRAMUSCULAR | Status: DC | PRN
  Administered 2018-03-06: 4 mg via INTRAVENOUS

## 2018-03-06 MED ORDER — VANCOMYCIN HCL 500 MG IV SOLR
INTRAVENOUS | Status: DC | PRN
  Administered 2018-03-06 (×3): 500 mg

## 2018-03-06 MED ORDER — CEFAZOLIN SODIUM-DEXTROSE 2-4 GM/100ML-% IV SOLN
INTRAVENOUS | Status: AC
  Filled 2018-03-06: qty 100

## 2018-03-06 MED ORDER — VANCOMYCIN HCL 1000 MG IV SOLR: INTRAVENOUS | Status: DC | PRN

## 2018-03-06 MED ORDER — ROCURONIUM BROMIDE 50 MG/5ML IV SOSY
PREFILLED_SYRINGE | INTRAVENOUS | Status: DC | PRN
  Administered 2018-03-06: 50 mg via INTRAVENOUS
  Administered 2018-03-06 (×3): 10 mg via INTRAVENOUS

## 2018-03-06 MED ORDER — PROMETHAZINE HCL 25 MG/ML IJ SOLN: 6.2500 mg | INTRAMUSCULAR | Status: DC | PRN

## 2018-03-06 MED ORDER — VANCOMYCIN HCL 500 MG IV SOLR
INTRAVENOUS | Status: AC
  Filled 2018-03-06: qty 1000

## 2018-03-06 MED ORDER — TRANEXAMIC ACID-NACL 1000-0.7 MG/100ML-% IV SOLN
INTRAVENOUS | Status: AC
  Filled 2018-03-06: qty 100

## 2018-03-06 MED ORDER — ENOXAPARIN SODIUM 40 MG/0.4ML ~~LOC~~ SOLN: 40.0000 mg | SUBCUTANEOUS | Status: DC

## 2018-03-06 MED ORDER — OXYCODONE HCL 5 MG PO TABS
10.0000 mg | ORAL_TABLET | ORAL | Status: DC | PRN
  Administered 2018-03-06 – 2018-03-09 (×15): 10 mg via ORAL
  Filled 2018-03-06 (×15): qty 2

## 2018-03-06 MED ORDER — ENOXAPARIN SODIUM 40 MG/0.4ML ~~LOC~~ SOLN
40.0000 mg | SUBCUTANEOUS | Status: DC
  Administered 2018-03-07 – 2018-03-09 (×3): 40 mg via SUBCUTANEOUS
  Filled 2018-03-06 (×3): qty 0.4

## 2018-03-06 MED ORDER — GABAPENTIN 100 MG PO CAPS
100.0000 mg | ORAL_CAPSULE | Freq: Three times a day (TID) | ORAL | Status: DC
  Administered 2018-03-06 – 2018-03-09 (×8): 100 mg via ORAL
  Filled 2018-03-06 (×8): qty 1

## 2018-03-06 MED ORDER — OXYCODONE HCL 5 MG/5ML PO SOLN: 5.0000 mg | Freq: Once | ORAL | Status: AC | PRN

## 2018-03-06 MED ORDER — METHOCARBAMOL 500 MG PO TABS
500.0000 mg | ORAL_TABLET | Freq: Three times a day (TID) | ORAL | Status: DC | PRN
  Administered 2018-03-06 – 2018-03-09 (×7): 500 mg via ORAL
  Filled 2018-03-06 (×7): qty 1

## 2018-03-06 MED ORDER — ACETAMINOPHEN 650 MG RE SUPP: 650.0000 mg | Freq: Four times a day (QID) | RECTAL | Status: DC | PRN

## 2018-03-06 MED ORDER — ONDANSETRON HCL 4 MG/2ML IJ SOLN
INTRAMUSCULAR | Status: AC
  Filled 2018-03-06: qty 2

## 2018-03-06 MED ORDER — MIDAZOLAM HCL 2 MG/2ML IJ SOLN
INTRAMUSCULAR | Status: AC
  Filled 2018-03-06: qty 2

## 2018-03-06 MED ORDER — TOBRAMYCIN SULFATE 1.2 G IJ SOLR
INTRAMUSCULAR | Status: DC | PRN
  Administered 2018-03-06: 1.2 g via TOPICAL

## 2018-03-06 MED ORDER — ACETAMINOPHEN 10 MG/ML IV SOLN
1000.0000 mg | Freq: Once | INTRAVENOUS | Status: DC | PRN
  Administered 2018-03-06: 1000 mg via INTRAVENOUS

## 2018-03-06 MED ORDER — SODIUM CHLORIDE 0.9 % IV SOLN
INTRAVENOUS | Status: DC
  Administered 2018-03-06: 19:00:00 via INTRAVENOUS

## 2018-03-06 MED ORDER — OXYCODONE HCL 5 MG PO TABS: 5.0000 mg | ORAL_TABLET | ORAL | Status: DC | PRN

## 2018-03-06 MED ORDER — FENTANYL CITRATE (PF) 100 MCG/2ML IJ SOLN
INTRAMUSCULAR | Status: DC | PRN
  Administered 2018-03-06 (×4): 50 ug via INTRAVENOUS
  Administered 2018-03-06: 150 ug via INTRAVENOUS
  Administered 2018-03-06 (×2): 50 ug via INTRAVENOUS

## 2018-03-06 MED ORDER — HYDROMORPHONE HCL 1 MG/ML IJ SOLN
INTRAMUSCULAR | Status: AC
  Filled 2018-03-06: qty 0.5

## 2018-03-06 MED ORDER — MIDAZOLAM HCL 5 MG/5ML IJ SOLN
INTRAMUSCULAR | Status: DC | PRN
  Administered 2018-03-06: 2 mg via INTRAVENOUS

## 2018-03-06 MED ORDER — HYDROMORPHONE HCL 1 MG/ML IJ SOLN
1.0000 mg | INTRAMUSCULAR | Status: DC | PRN
  Administered 2018-03-07 – 2018-03-09 (×7): 1 mg via INTRAVENOUS
  Filled 2018-03-06 (×8): qty 1

## 2018-03-06 MED ORDER — CEFAZOLIN SODIUM-DEXTROSE 2-4 GM/100ML-% IV SOLN
2.0000 g | Freq: Three times a day (TID) | INTRAVENOUS | Status: AC
  Administered 2018-03-06 – 2018-03-07 (×3): 2 g via INTRAVENOUS
  Filled 2018-03-06 (×3): qty 100

## 2018-03-06 MED ORDER — CEFAZOLIN SODIUM 1 G IJ SOLR
INTRAMUSCULAR | Status: AC
  Filled 2018-03-06: qty 20

## 2018-03-06 MED ORDER — FENTANYL CITRATE (PF) 100 MCG/2ML IJ SOLN
INTRAMUSCULAR | Status: AC
  Filled 2018-03-06: qty 2

## 2018-03-06 MED ORDER — FENTANYL CITRATE (PF) 250 MCG/5ML IJ SOLN
INTRAMUSCULAR | Status: AC
  Filled 2018-03-06: qty 5

## 2018-03-06 MED ORDER — VANCOMYCIN HCL 500 MG IV SOLR
INTRAVENOUS | Status: AC
  Filled 2018-03-06: qty 500

## 2018-03-06 MED ORDER — HYDROMORPHONE HCL 1 MG/ML IJ SOLN
0.2500 mg | INTRAMUSCULAR | Status: DC | PRN
  Administered 2018-03-06: 0.5 mg via INTRAVENOUS

## 2018-03-06 MED ORDER — 0.9 % SODIUM CHLORIDE (POUR BTL) OPTIME
TOPICAL | Status: DC | PRN
  Administered 2018-03-06: 1000 mL

## 2018-03-06 MED ORDER — OXYCODONE HCL 5 MG PO TABS
ORAL_TABLET | ORAL | Status: AC
  Filled 2018-03-06: qty 1

## 2018-03-06 MED ORDER — SCOPOLAMINE 1 MG/3DAYS TD PT72
MEDICATED_PATCH | TRANSDERMAL | Status: AC
  Filled 2018-03-06: qty 1

## 2018-03-06 MED ORDER — PROPOFOL 10 MG/ML IV BOLUS
INTRAVENOUS | Status: AC
  Filled 2018-03-06: qty 20

## 2018-03-06 MED ORDER — OXYCODONE HCL 5 MG PO TABS
5.0000 mg | ORAL_TABLET | Freq: Once | ORAL | Status: AC | PRN
  Administered 2018-03-06: 5 mg via ORAL

## 2018-03-06 MED ORDER — LACTATED RINGERS IV SOLN
INTRAVENOUS | Status: DC | PRN
  Administered 2018-03-06 (×3): via INTRAVENOUS

## 2018-03-06 MED ORDER — FENTANYL CITRATE (PF) 100 MCG/2ML IJ SOLN
25.0000 ug | INTRAMUSCULAR | Status: DC | PRN
  Administered 2018-03-06 (×2): 25 ug via INTRAVENOUS
  Administered 2018-03-06 (×2): 50 ug via INTRAVENOUS

## 2018-03-06 MED ORDER — ACETAMINOPHEN 325 MG PO TABS
650.0000 mg | ORAL_TABLET | Freq: Four times a day (QID) | ORAL | Status: DC | PRN
  Administered 2018-03-07: 650 mg via ORAL
  Filled 2018-03-06: qty 2

## 2018-03-06 MED ORDER — TRANEXAMIC ACID-NACL 1000-0.7 MG/100ML-% IV SOLN
1000.0000 mg | INTRAVENOUS | Status: AC
  Administered 2018-03-06: 1000 mg via INTRAVENOUS
  Filled 2018-03-06: qty 100

## 2018-03-06 MED ORDER — LIDOCAINE 2% (20 MG/ML) 5 ML SYRINGE
INTRAMUSCULAR | Status: DC | PRN
  Administered 2018-03-06: 80 mg via INTRAVENOUS

## 2018-03-06 MED ORDER — LIDOCAINE 2% (20 MG/ML) 5 ML SYRINGE
INTRAMUSCULAR | Status: AC
  Filled 2018-03-06: qty 10

## 2018-03-06 MED ORDER — HYDROMORPHONE HCL 1 MG/ML IJ SOLN
INTRAMUSCULAR | Status: DC | PRN
  Administered 2018-03-06: .5 mg via INTRAVENOUS

## 2018-03-06 MED ORDER — LACTATED RINGERS IV SOLN
INTRAVENOUS | Status: DC
  Administered 2018-03-06: 07:00:00 via INTRAVENOUS

## 2018-03-06 MED ORDER — PROPOFOL 10 MG/ML IV BOLUS
INTRAVENOUS | Status: DC | PRN
  Administered 2018-03-06: 200 mg via INTRAVENOUS

## 2018-03-06 MED ORDER — HYDROMORPHONE HCL 1 MG/ML IJ SOLN
INTRAMUSCULAR | Status: AC
  Filled 2018-03-06: qty 1

## 2018-03-06 MED ORDER — SCOPOLAMINE 1 MG/3DAYS TD PT72
1.0000 | MEDICATED_PATCH | TRANSDERMAL | Status: DC
  Administered 2018-03-06: 1.5 mg via TRANSDERMAL

## 2018-03-06 MED ORDER — DOUBLE ANTIBIOTIC 500-10000 UNIT/GM EX OINT
TOPICAL_OINTMENT | CUTANEOUS | Status: AC
  Filled 2018-03-06: qty 1

## 2018-03-06 MED ORDER — SUGAMMADEX SODIUM 500 MG/5ML IV SOLN
INTRAVENOUS | Status: DC | PRN
  Administered 2018-03-06: 200 mg via INTRAVENOUS

## 2018-03-06 MED ORDER — VITAMIN C 500 MG PO TABS
500.0000 mg | ORAL_TABLET | Freq: Every day | ORAL | Status: DC
  Administered 2018-03-07 – 2018-03-09 (×3): 500 mg via ORAL
  Filled 2018-03-06 (×4): qty 1

## 2018-03-06 MED ORDER — EPHEDRINE 5 MG/ML INJ
INTRAVENOUS | Status: AC
  Filled 2018-03-06: qty 10

## 2018-03-06 MED ORDER — DEXAMETHASONE SODIUM PHOSPHATE 10 MG/ML IJ SOLN
INTRAMUSCULAR | Status: AC
  Filled 2018-03-06: qty 1

## 2018-03-06 MED ORDER — BACITRACIN ZINC 500 UNIT/GM EX OINT
TOPICAL_OINTMENT | CUTANEOUS | Status: DC | PRN
  Administered 2018-03-06: 1 via TOPICAL

## 2018-03-06 MED ORDER — TOBRAMYCIN SULFATE 1.2 G IJ SOLR
INTRAMUSCULAR | Status: AC
  Filled 2018-03-06: qty 1.2

## 2018-03-06 MED ORDER — CHLORHEXIDINE GLUCONATE 4 % EX LIQD: 60.0000 mL | Freq: Once | CUTANEOUS | Status: DC

## 2018-03-06 MED ORDER — PROPOFOL 500 MG/50ML IV EMUL
INTRAVENOUS | Status: DC | PRN
  Administered 2018-03-06: 10 ug/kg/min via INTRAVENOUS

## 2018-03-06 MED ORDER — DEXMEDETOMIDINE HCL 200 MCG/2ML IV SOLN
INTRAVENOUS | Status: DC | PRN
  Administered 2018-03-06 (×4): 4 ug via INTRAVENOUS

## 2018-03-06 MED ORDER — DEXAMETHASONE SODIUM PHOSPHATE 10 MG/ML IJ SOLN
INTRAMUSCULAR | Status: DC | PRN
  Administered 2018-03-06: 10 mg via INTRAVENOUS

## 2018-03-06 SURGICAL SUPPLY — 84 items
BANDAGE ELASTIC 4 VELCRO ST LF (GAUZE/BANDAGES/DRESSINGS) ×3 IMPLANT
BANDAGE ELASTIC 6 VELCRO ST LF (GAUZE/BANDAGES/DRESSINGS) ×3 IMPLANT
BIT DRILL NCB L24 JOINTS 2.5 (BIT) ×2 IMPLANT
BIT DRILL NCB L24 JOINTS 2.5MM (BIT) ×1
BIT DRILL QC 3.3X195 (BIT) ×3 IMPLANT
BNDG COHESIVE 6X5 TAN STRL LF (GAUZE/BANDAGES/DRESSINGS) ×3 IMPLANT
BNDG ELASTIC 6X15 VLCR STRL LF (GAUZE/BANDAGES/DRESSINGS) ×3 IMPLANT
BONE CANC CHIPS 40CC CAN1/2 (Bone Implant) ×3 IMPLANT
BRUSH SCRUB SURG 4.25 DISP (MISCELLANEOUS) ×6 IMPLANT
CAP LOCK NCB (Cap) ×12 IMPLANT
CHIPS CANC BONE 40CC CAN1/2 (Bone Implant) ×1 IMPLANT
CHLORAPREP W/TINT 26ML (MISCELLANEOUS) ×6 IMPLANT
CLIP LOCKING FOR RIA (CLIP) ×3 IMPLANT
COVER SURGICAL LIGHT HANDLE (MISCELLANEOUS) ×3 IMPLANT
COVER WAND RF STERILE (DRAPES) IMPLANT
DRAPE C-ARM 42X72 X-RAY (DRAPES) ×3 IMPLANT
DRAPE C-ARMOR (DRAPES) ×3 IMPLANT
DRAPE IMP U-DRAPE 54X76 (DRAPES) ×3 IMPLANT
DRAPE SURG 17X23 STRL (DRAPES) ×6 IMPLANT
DRAPE U-SHAPE 47X51 STRL (DRAPES) ×6 IMPLANT
DRIVE SHAFT SEAL STERILE (ORTHOPEDIC DISPOSABLE SUPPLIES) ×3 IMPLANT
DRSG ADAPTIC 3X8 NADH LF (GAUZE/BANDAGES/DRESSINGS) ×3 IMPLANT
DRSG MEPILEX BORDER 4X4 (GAUZE/BANDAGES/DRESSINGS) ×3 IMPLANT
ELECT REM PT RETURN 9FT ADLT (ELECTROSURGICAL) ×3
ELECTRODE REM PT RTRN 9FT ADLT (ELECTROSURGICAL) ×1 IMPLANT
GAUZE SPONGE 4X4 12PLY STRL (GAUZE/BANDAGES/DRESSINGS) ×6 IMPLANT
GLOVE BIO SURGEON STRL SZ 6.5 (GLOVE) ×8 IMPLANT
GLOVE BIO SURGEON STRL SZ7.5 (GLOVE) ×6 IMPLANT
GLOVE BIO SURGEONS STRL SZ 6.5 (GLOVE) ×4
GLOVE BIOGEL PI IND STRL 6.5 (GLOVE) ×1 IMPLANT
GLOVE BIOGEL PI IND STRL 7.0 (GLOVE) ×1 IMPLANT
GLOVE BIOGEL PI IND STRL 7.5 (GLOVE) ×1 IMPLANT
GLOVE BIOGEL PI INDICATOR 6.5 (GLOVE) ×2
GLOVE BIOGEL PI INDICATOR 7.0 (GLOVE) ×2
GLOVE BIOGEL PI INDICATOR 7.5 (GLOVE) ×2
GOWN STRL REUS W/ TWL LRG LVL3 (GOWN DISPOSABLE) ×4 IMPLANT
GOWN STRL REUS W/TWL LRG LVL3 (GOWN DISPOSABLE) ×8
GRAFT FILTER FOR RIA 520 LGTH (MISCELLANEOUS) ×3 IMPLANT
GUIDEWIRE 3.2X400 (WIRE) ×3 IMPLANT
HANDPIECE INTERPULSE COAX TIP (DISPOSABLE) ×2
K-WIRE 2.0 (WIRE) ×4
K-WIRE FXSTD 280X2XNS SS (WIRE) ×2
KIT BASIN OR (CUSTOM PROCEDURE TRAY) ×3 IMPLANT
KIT TURNOVER KIT B (KITS) ×3 IMPLANT
KWIRE FXSTD 280X2XNS SS (WIRE) ×2 IMPLANT
MANIFOLD NEPTUNE II (INSTRUMENTS) ×3 IMPLANT
MIX DBX 20CC MTF (Putty) ×3 IMPLANT
NS IRRIG 1000ML POUR BTL (IV SOLUTION) ×3 IMPLANT
PACK TOTAL JOINT (CUSTOM PROCEDURE TRAY) ×3 IMPLANT
PAD ARMBOARD 7.5X6 YLW CONV (MISCELLANEOUS) ×3 IMPLANT
PAD CAST 4YDX4 CTTN HI CHSV (CAST SUPPLIES) ×1 IMPLANT
PADDING CAST COTTON 4X4 STRL (CAST SUPPLIES) ×2
PADDING CAST COTTON 6X4 STRL (CAST SUPPLIES) ×3 IMPLANT
PLATE NCB 18 H STR FEMUR (Plate) ×3 IMPLANT
REAMER HEAD 14.5 F/IRRIGATION (Joint) ×3 IMPLANT
REAMER ROD DEEP FLUTE 2.5X950 (INSTRUMENTS) ×3 IMPLANT
SCREW NCB 4.0 28MM (Screw) ×3 IMPLANT
SCREW NCB 4.0 32MM (Screw) ×6 IMPLANT
SCREW NCB 4.0MX34M (Screw) ×3 IMPLANT
SCREW NCB 4.0MX55M (Screw) ×3 IMPLANT
SCREW NCB 4.0MX65M (Screw) ×3 IMPLANT
SCREW NCB 5.0X55MM (Screw) ×3 IMPLANT
SET CYSTO W/LG BORE CLAMP LF (SET/KITS/TRAYS/PACK) ×3 IMPLANT
SET HNDPC FAN SPRY TIP SCT (DISPOSABLE) ×1 IMPLANT
SPONGE LAP 18X18 X RAY DECT (DISPOSABLE) ×3 IMPLANT
STAPLER VISISTAT 35W (STAPLE) ×3 IMPLANT
STOCKINETTE IMPERVIOUS LG (DRAPES) ×3 IMPLANT
SUT ETHILON 2 0 FS 18 (SUTURE) ×3 IMPLANT
SUT ETHILON 3 0 PS 1 (SUTURE) ×9 IMPLANT
SUT MNCRL AB 3-0 PS2 18 (SUTURE) IMPLANT
SUT MON AB 2-0 CT1 36 (SUTURE) IMPLANT
SUT PROLENE 0 CT (SUTURE) IMPLANT
SUT VIC AB 0 CT1 27 (SUTURE)
SUT VIC AB 0 CT1 27XBRD ANBCTR (SUTURE) IMPLANT
SUT VIC AB 1 CT1 27 (SUTURE)
SUT VIC AB 1 CT1 27XBRD ANTBC (SUTURE) IMPLANT
SUT VIC AB 2-0 CT1 27 (SUTURE) ×2
SUT VIC AB 2-0 CT1 TAPERPNT 27 (SUTURE) ×1 IMPLANT
TOWEL OR 17X24 6PK STRL BLUE (TOWEL DISPOSABLE) IMPLANT
TOWEL OR 17X26 10 PK STRL BLUE (TOWEL DISPOSABLE) ×3 IMPLANT
TRAY FOLEY MTR SLVR 16FR STAT (SET/KITS/TRAYS/PACK) ×3 IMPLANT
TUBE ASSEMBLY RIA STERILE (MISCELLANEOUS) ×3 IMPLANT
TUBE CONNECTING 20'X1/4 (TUBING) ×1
TUBE CONNECTING 20X1/4 (TUBING) ×2 IMPLANT

## 2018-03-06 NOTE — Plan of Care (Signed)

## 2018-03-06 NOTE — Progress Notes (Signed)
Pt arrived to room 5N12 via bed after surgery. Received report from Chales AbrahamsMary Ann, RN in PACU. See assessment. Will continue to monitor.
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
## 2018-03-06 NOTE — Plan of Care (Signed)
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
## 2018-03-06 NOTE — Anesthesia Postprocedure Evaluation (Signed)
Anesthesia Post Note  Patient: Wesley Sandoval  Procedure(s) Performed: REPAIR LEFT FEMUR NONUNION WITH RIA HARVEST FROM RIGHT FEMUR (Bilateral Leg Upper)     Patient location during evaluation: PACU Anesthesia Type: General Level of consciousness: awake and alert Pain management: pain level controlled Vital Signs Assessment: post-procedure vital signs reviewed and stable Respiratory status: spontaneous breathing, nonlabored ventilation and respiratory function stable Cardiovascular status: blood pressure returned to baseline and stable Postop Assessment: no apparent nausea or vomiting Anesthetic complications: no    Last Vitals:  Vitals:   03/06/18 1638 03/06/18 1652  BP: 128/90 132/89  Pulse: 97 96  Resp: 18   Temp:  36.7 C  SpO2: 100% 99%    Last Pain:  Vitals:   03/06/18 1835  TempSrc:   PainSc: 7                  Beryle Lathehomas E Khloi Rawl

## 2018-03-06 NOTE — Anesthesia Procedure Notes (Signed)
Procedure Name: Intubation Date/Time: 03/06/2018 10:00 AM Performed by: Neldon Newport, CRNA Pre-anesthesia Checklist: Timeout performed, Patient being monitored, Suction available, Emergency Drugs available and Patient identified Patient Re-evaluated:Patient Re-evaluated prior to induction Oxygen Delivery Method: Circle system utilized Preoxygenation: Pre-oxygenation with 100% oxygen Induction Type: IV induction Ventilation: Mask ventilation without difficulty Laryngoscope Size: Mac and 4 Grade View: Grade I Tube type: Oral Tube size: 7.5 mm Number of attempts: 1 Placement Confirmation: breath sounds checked- equal and bilateral,  positive ETCO2 and ETT inserted through vocal cords under direct vision Secured at: 23 cm Tube secured with: Tape Dental Injury: Teeth and Oropharynx as per pre-operative assessment

## 2018-03-06 NOTE — Transfer of Care (Signed)
Immediate Anesthesia Transfer of Care Note  Patient: Wesley Sandoval  Procedure(s) Performed: REPAIR LEFT FEMUR NONUNION WITH RIA HARVEST FROM RIGHT FEMUR (Bilateral Leg Upper)  Patient Location: PACU  Anesthesia Type:General  Level of Consciousness: awake, alert  and oriented  Airway & Oxygen Therapy: Patient Spontanous Breathing and Patient connected to nasal cannula oxygen  Post-op Assessment: Report given to RN, Post -op Vital signs reviewed and stable and Patient moving all extremities X 4  Post vital signs: Reviewed and stable  Last Vitals:  Vitals Value Taken Time  BP 136/80 03/06/2018  2:08 PM  Temp    Pulse 112 03/06/2018  2:11 PM  Resp 19 03/06/2018  2:11 PM  SpO2 100 % 03/06/2018  2:11 PM  Vitals shown include unvalidated device data.  Last Pain:  Vitals:   03/06/18 0720  PainSc: 0-No pain         Complications: No apparent anesthesia complications

## 2018-03-07 ENCOUNTER — Encounter (HOSPITAL_COMMUNITY): Payer: Self-pay | Admitting: Student

## 2018-03-07 DIAGNOSIS — R000 Tachycardia, unspecified: Secondary | ICD-10-CM

## 2018-03-07 DIAGNOSIS — S72352K Displaced comminuted fracture of shaft of left femur, subsequent encounter for closed fracture with nonunion: Secondary | ICD-10-CM

## 2018-03-07 DIAGNOSIS — T1490XA Injury, unspecified, initial encounter: Secondary | ICD-10-CM

## 2018-03-07 DIAGNOSIS — G8918 Other acute postprocedural pain: Secondary | ICD-10-CM

## 2018-03-07 LAB — CBC
HCT: 38.2 % — ABNORMAL LOW (ref 39.0–52.0)
Hemoglobin: 12.4 g/dL — ABNORMAL LOW (ref 13.0–17.0)
MCH: 29 pg (ref 26.0–34.0)
MCHC: 32.5 g/dL (ref 30.0–36.0)
MCV: 89.3 fL (ref 80.0–100.0)
NRBC: 0 % (ref 0.0–0.2)
Platelets: 342 10*3/uL (ref 150–400)
RBC: 4.28 MIL/uL (ref 4.22–5.81)
RDW: 13.4 % (ref 11.5–15.5)
WBC: 11.4 10*3/uL — AB (ref 4.0–10.5)
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
## 2018-03-07 NOTE — Progress Notes (Signed)
Inpatient Rehabilitation Admissions Coordinator  I will follow up tomorrow with pt to discuss his progress with transfers and if he continues to prefer d/c home, or if he would like me to pursue [employer] insurance approval for a possible inpt rehab admission.  Ottie GlazierBarbara Caylan Chenard, RN, MSN Rehab Admissions Coordinator [phone] 03/07/2018 4:51 PM
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
## 2018-03-08 NOTE — Progress Notes (Signed)
Inpatient Rehabilitation Admissions Coordinator  I met with patient and his cousin at bedside. Patient bent over in bed at the waist complaining of pain to his left thigh. He is limited currently by the pain that he is having in his left thigh. I assisted him to reposition and to encourage him to keep his head down with his legs elevated as much as possible. Encourage the use of ice to his thigh and for him to discuss with MD the tightness/pressure he is feeling at his left thigh. Discussed the need to keep his " toes above his nose" at all times except when eating. Patient prefers d/c home with his cousins and wife as caregiver support. I will follow up tomorrow with his progress.  Danne Baxter, RN, MSN Rehab Admissions Coordinator [phone] 03/08/2018 11:45 AM
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
## 2018-03-08 NOTE — Plan of Care (Signed)
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
## 2018-03-09 MED ORDER — OXYCODONE HCL 5 MG PO TABS: 5.0000 mg | ORAL_TABLET | ORAL | 0 refills | Status: DC | PRN

## 2018-03-09 MED ORDER — METHOCARBAMOL 500 MG PO TABS: 500.0000 mg | ORAL_TABLET | Freq: Three times a day (TID) | ORAL | 0 refills | Status: DC | PRN

## 2018-03-09 NOTE — Progress Notes (Signed)
Physical Therapy Treatment Patient Details Name: Wesley Sandoval Leipold MRN: [id number] DOB: 10/22/1985 Today's Date: 03/09/2018    History of Present Illness Pt is an 32 y.o. male [facility]He has previously undergone IM nailing of his right tibia; and multiple I &D of his left open femur wound as well as hishis left open tibia fracture. At last visit to the OR on 01/19/2018 he underwent repeat I&D of his left open femur as well as ORIF of his femur with antibiotic spacer placement as part of staged fixation of his intra-articular joint involvement of his distal femur and IM nailing of his left tibia fracture. Patient returned for final portion of staged procedure and is now s/p removal of the antibiotic spacer and placement of bone graft from right femur as well as IM Nail L femur. `    PT Comments    Pt performed transfer training, followed by RLE strengthening.  Ice pack placed to L thigh post tx.  Plan for return home today to attend his grandfather's funeral.  He has adequate support at home to return and all necessary equipment to manage and maintain his precautions.  Pt would benefit from HHPT for home safety eval and progression to OPPT when ready.  Will inform supervising PT of recommendations at this time.     Follow Up Recommendations  Home health PT;Supervision for mobility/OOB     Equipment Recommendations  None recommended by PT    Recommendations for Other Services       Precautions / Restrictions Precautions Precautions: Fall Restrictions Weight Bearing Restrictions: Yes RLE Weight Bearing: Weight bearing as tolerated LLE Weight Bearing: Touchdown weight bearing Other Position/Activity Restrictions: LLE TDWB only for transfers    Mobility  Bed Mobility Overal bed mobility: Needs Assistance Bed Mobility: Supine to Sit     Supine to sit: Min guard     General bed mobility comments: increased time and effort, heavy reliance on bilateral UEs,  light assist needed for placement of L LE onto ground   Transfers Overall transfer level: Needs assistance Equipment used: Rolling walker (2 wheeled) Transfers: Sit to/from Stand Sit to Stand: +2 safety/equipment;Min assist         General transfer comment: Pt able to power up into standing partially but when transferring hands to RW his pulls on device and requires min assistance to brace RW to avoid LOB.  Pt required cues for hand and foot placement to improve ease of transfer.    Ambulation/Gait Ambulation/Gait assistance: (NT)               Stairs             Wheelchair Mobility    Modified Rankin (Stroke Patients Only)       Balance Overall balance assessment: Needs assistance Sitting-balance support: Feet supported;No upper extremity supported Sitting balance-Leahy Scale: Good     Standing balance support: Bilateral upper extremity supported Standing balance-Leahy Scale: Poor Standing balance comment: reliant on bilateral UEs                             Cognition Arousal/Alertness: Awake/alert Behavior During Therapy: WFL for tasks assessed/performed Overall Cognitive Status: Within Functional Limits for tasks assessed  Exercises General Exercises - Lower Extremity Ankle Circles/Pumps: AROM;Both;10 reps;Supine Quad Sets: AROM;Strengthening;Right;Supine Heel Slides: AROM;Right;10 reps;Supine Hip ABduction/ADduction: AROM;Right;10 reps;Supine Straight Leg Raises: AROM;Right;10 reps;Supine    General Comments        Pertinent Vitals/Pain Pain Assessment: Faces Pain Score: 9  Pain Location: LLE >RLE Mostly L thigh.   Pain Descriptors / Indicators: Throbbing;Pressure;Discomfort;Grimacing;Guarding Pain Intervention(s): Limited activity within patient's tolerance;Ice applied;Repositioned    Home Living                      Prior Function            PT Goals  (current goals can now be found in the care plan section) Acute Rehab PT Goals Patient Stated Goal: decrease pain, be home for Christmas Potential to Achieve Goals: Good Progress towards PT goals: Progressing toward goals    Frequency    Min 5X/week      PT Plan Current plan remains appropriate    Co-evaluation PT/OT/SLP Co-Evaluation/Treatment: Yes Reason for Co-Treatment: Other (comment)(need to see early for d/c home for his grand fathers funeral.  )          AM-PAC PT "6 Clicks" Mobility   Outcome Measure  Help needed turning from your back to your side while in a flat bed without using bedrails?: A Little Help needed moving from lying on your back to sitting on the side of a flat bed without using bedrails?: A Little Help needed moving to and from a bed to a chair (including a wheelchair)?: A Little Help needed standing up from a chair using your arms (e.g., wheelchair or bedside chair)?: A Little Help needed to walk in hospital room?: Total Help needed climbing 3-5 steps with a railing? : Total 6 Click Score: 14    End of Session Equipment Utilized During Treatment: Gait belt Activity Tolerance: Patient tolerated treatment well Patient left: in chair;with call bell/phone within reach;with family/visitor present Nurse Communication: Mobility status PT Visit Diagnosis: Other abnormalities of gait and mobility (R26.89);Pain Pain - Right/Left: (Bilateral) Pain - part of body: Leg     Time: 2956-21300900-0924 PT Time Calculation (min) (ACUTE ONLY): 24 min  Charges:  $Therapeutic Activity: 8-22 mins                     Joycelyn RuaAimee Panagiotis Oelkers, PTA Acute Rehabilitation Services Pager [phone] Office [phone]     Dinia Joynt Artis DelayJ Levita Monical 03/09/2018, 9:42 AM
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
## 2018-03-09 NOTE — Discharge Instructions (Signed)
Orthopaedic Trauma Service Discharge Instructions   General Discharge Instructions  WEIGHT BEARING STATUS: Weightbearing as tolerated to right leg, touch down weightbearing to left leg  RANGE OF MOTION/ACTIVITY:No restrictions continue to work on knee range of motion  Wound Care:Okay to leave incisions open to air and shower  DVT/PE prophylaxis: Take a full dose aspirin (325mg () for to prevent blood clot  Diet: as you were eating previously.  Can use over the counter stool softeners and bowel preparations, such as Miralax, to help with bowel movements.  Narcotics can be constipating.  Be sure to drink plenty of fluids  PAIN MEDICATION USE AND EXPECTATIONS  You have likely been given narcotic medications to help control your pain.  After a traumatic event that results in an fracture (broken bone) with or without surgery, it is ok to use narcotic pain medications to help control one's pain.  We understand that everyone responds to pain differently and each individual patient will be evaluated on a regular basis for the continued need for narcotic medications. Ideally, narcotic medication use should last no more than 6-8 weeks (coinciding with fracture healing).   As a patient it is your responsibility as well to monitor narcotic medication use and report the amount and frequency you use these medications when you come to your office visit.   We would also advise that if you are using narcotic medications, you should take a dose prior to therapy to maximize you participation.  IF YOU ARE ON NARCOTIC MEDICATIONS IT IS NOT PERMISSIBLE TO OPERATE A MOTOR VEHICLE (MOTORCYCLE/CAR/TRUCK/MOPED) OR HEAVY MACHINERY DO NOT MIX NARCOTICS WITH OTHER CNS (CENTRAL NERVOUS SYSTEM) DEPRESSANTS SUCH AS ALCOHOL   STOP SMOKING OR USING NICOTINE PRODUCTS!!!!  As discussed nicotine severely impairs your body's ability to heal surgical and traumatic wounds but also impairs bone healing.  Wounds and bone heal by  forming microscopic blood vessels (angiogenesis) and nicotine is a vasoconstrictor (essentially, shrinks blood vessels).  Therefore, if vasoconstriction occurs to these microscopic blood vessels they essentially disappear and are unable to deliver necessary nutrients to the healing tissue.  This is one modifiable factor that you can do to dramatically increase your chances of healing your injury.    (This means no smoking, no nicotine gum, patches, etc)  DO NOT USE NONSTEROIDAL ANTI-INFLAMMATORY DRUGS (NSAID'S)  Using products such as Advil (ibuprofen), Aleve (naproxen), Motrin (ibuprofen) for additional pain control during fracture healing can delay and/or prevent the healing response.  If you would like to take over the counter (OTC) medication, Tylenol (acetaminophen) is ok.  However, some narcotic medications that are given for pain control contain acetaminophen as well. Therefore, you should not exceed more than 4000 mg of tylenol in a day if you do not have liver disease.  Also note that there are may OTC medicines, such as cold medicines and allergy medicines that my contain tylenol as well.  If you have any questions about medications and/or interactions please ask your doctor/PA or your pharmacist.      ICE AND ELEVATE INJURED/OPERATIVE EXTREMITY  Using ice and elevating the injured extremity above your heart can help with swelling and pain control.  Icing in a pulsatile fashion, such as 20 minutes on and 20 minutes off, can be followed.    Do not place ice directly on skin. Make sure there is a barrier between to skin and the ice pack.    Using frozen items such as frozen peas works well as the conform nicely to  the are that needs to be iced.  USE AN ACE WRAP OR TED HOSE FOR SWELLING CONTROL  In addition to icing and elevation, Ace wraps or TED hose are used to help limit and resolve swelling.  It is recommended to use Ace wraps or TED hose until you are informed to stop.    When using Ace  Wraps start the wrapping distally (farthest away from the body) and wrap proximally (closer to the body)   Example: If you had surgery on your leg or thing and you do not have a splint on, start the ace wrap at the toes and work your way up to the thigh        If you had surgery on your upper extremity and do not have a splint on, start the ace wrap at your fingers and work your way up to the upper arm  CALL THE OFFICE WITH ANY QUESTIONS OR CONCERNS: [phone]      Discharge Wound Care Instructions  Do NOT apply any ointments, solutions or lotions to pin sites or surgical wounds.  These prevent needed drainage and even though solutions like hydrogen peroxide kill bacteria, they also damage cells lining the pin sites that help fight infection.  Applying lotions or ointments can keep the wounds moist and can cause them to breakdown and open up as well. This can increase the risk for infection. When in doubt call the office.  Surgical incisions should be dressed daily.  If any drainage is noted, use one layer of adaptic, then gauze, Kerlix, and an ace wrap.  Once the incision is completely dry and without drainage, it may be left open to air out.  Showering may begin 36-48 hours later.  Cleaning gently with soap and water.

## 2018-03-09 NOTE — Progress Notes (Signed)
Nsg Discharge Note  Admit Date:  03/06/2018 Discharge date: 03/09/2018   Wesley Sandoval to be D/C'd Home per MD order.  AVS completed.  Copy for chart, and copy for patient signed, and dated. Patient/caregiver able to verbalize understanding.  Discharge Medication: Allergies as of 03/09/2018   No Known Allergies     Medication List    STOP taking these medications   enoxaparin 40 MG/0.4ML injection Commonly known as:  LOVENOX     TAKE these medications   acetaminophen 325 MG tablet Commonly known as:  TYLENOL Take 2 tablets (650 mg total) by mouth every 6 (six) hours.   BIOTIN PO Take 60 mg by mouth daily.   ferrous sulfate 325 (65 FE) MG tablet Take 325 mg by mouth daily with breakfast.   FISH OIL PO Take 90 mg by mouth daily.   gabapentin 100 MG capsule Commonly known as:  NEURONTIN Take 100 mg by mouth 3 (three) times daily.   methocarbamol 500 MG tablet Commonly known as:  ROBAXIN Take 1 tablet (500 mg total) by mouth every 8 (eight) hours as needed for muscle spasms.   niacin 500 MG tablet Take 500 mg by mouth at bedtime.   oxyCODONE 5 MG immediate release tablet Commonly known as:  Oxy IR/ROXICODONE Take 1 tablet (5 mg total) by mouth every 4 (four) hours as needed for moderate pain.   Potassium 99 MG Tabs Take 99 mg by mouth daily.   traMADol 50 MG tablet Commonly known as:  ULTRAM Take 50 mg by mouth every 6 (six) hours as needed for moderate pain.   vitamin C 500 MG tablet Commonly known as:  ASCORBIC ACID Take 500 mg by mouth daily.   zinc gluconate 50 MG tablet Take 50 mg by mouth daily.       Discharge Assessment: Vitals:   03/09/18 0519 03/09/18 0819  BP: (!) 115/91 102/87  Pulse: (!) 124 (!) 116  Resp: 20   Temp: 98.3 F (36.8 C)   SpO2: 99% 100%   Skin clean, dry and intact without evidence of skin break down, no evidence of skin tears noted. IV catheter discontinued intact. Site without signs and symptoms of  complications - no redness or edema noted at insertion site, patient denies c/o pain - only slight tenderness at site.  Dressing with slight pressure applied.  D/c Instructions-Education: Discharge instructions given to patient/family with verbalized understanding. D/c education completed with patient/family including follow up instructions, medication list, d/c activities limitations if indicated, with other d/c instructions as indicated by MD - patient able to verbalize understanding, all questions fully answered. Patient instructed to return to ED, call 911, or call MD for any changes in condition.  Patient escorted via WC, and D/C home via private auto.  Nelda MarseilleGeorge G Rosemary Mossbarger, RN 03/09/2018 12:32 PM

## 2018-03-09 NOTE — Care Management Note (Addendum)
Case Management Note  Patient Details  Name: Wesley Sandoval MRN: [id number] Date of Birth: Feb 03, 1986  Subjective/Objective: 32 yr old gentleman s/p  removal of antibiotic spacer, ORIF left femur fracture with medial plate, repair of left femur nonunion with autograft from right femur on 03/06/18                 Action/Plan: Case manager spoke with patient concerning discharge plan and DME. Patient has all necessary DME from initial surgery. Choice for Home Health agency offered, referral called to Shon Milletan Phillips, [facility]. Patient will have family support at discharge.    Expected Discharge Date:  03/09/18               Expected Discharge Plan:  Home w Home Health Services  In-House Referral:     Discharge planning Services  CM Consult  Post Acute Care Choice:  Home Health Choice offered to:  Patient  DME Arranged:  N/A(has RW,3in1 and w/c) DME Agency:  NA  HH Arranged:  PT HH Agency:  [facility]  Status of Service:  Completed, signed off  If discussed at Long Length of Stay Meetings, dates discussed:    Additional Comments:10:17amCase manager notified by Shon Milletan Phillips, [facility] Liaison that they can not accept BC/BS patient at this time. CM will [facility].  Durenda GuthrieBrady, Gregoire Bennis Naomi, RN 03/09/2018, 9:57 AM

## 2018-03-09 NOTE — Progress Notes (Signed)
Inpatient Rehabilitation Admissions Coordinator  Patient has progressed to be able to d/c home. I met with patient at bedside and will sign off at this time.  Danne Baxter, RN, MSN Rehab Admissions Coordinator [phone] 03/09/2018 11:10 AM'

## 2018-03-09 NOTE — Progress Notes (Signed)
Occupational Therapy Treatment Patient Details Name: Wesley Sandoval MRN: [id number] DOB: July 11, 1985 Today's Date: 03/09/2018    History of present illness Pt is an 32 y.[facility]. [facility]He has previously undergone IM nailing of his right tibia; and multiple I &D of his left open femur wound as well as hishis left open tibia fracture. At last visit to the OR on 01/19/2018 he underwent repeat I&D of his left open femur as well as ORIF of his femur with antibiotic spacer placement as part of staged fixation of his intra-articular joint involvement of his distal femur and IM nailing of his left tibia fracture. Patient returned for final portion of staged procedure and is now s/p removal of the antibiotic spacer and placement of bone graft from right femur as well as IM Nail L femur. `   OT comments  Pt progressing towards OT goals this session. Pt with greatly improved transfer - still requires at least 1 person to stabilize walker with transfers. Spent significant time talking about bathing and safety in tub - using DME for ease and safety. Pt also provided with leg lifter. Pt is happy to be going home for grandfather's funeral and Christmas with his family.   Follow Up Recommendations  No OT follow up;Supervision/Assistance - 24 hour    Equipment Recommendations  Tub/shower bench    Recommendations for Other Services      Precautions / Restrictions Precautions Precautions: Fall Restrictions Weight Bearing Restrictions: Yes RLE Weight Bearing: Weight bearing as tolerated LLE Weight Bearing: Touchdown weight bearing Other Position/Activity Restrictions: LLE TDWB only for transfers       Mobility Bed Mobility Overal bed mobility: Needs Assistance Bed Mobility: Supine to Sit     Supine to sit: Min guard     General bed mobility comments: increased time and effort, heavy reliance on bilateral UEs, light assist needed for placement of L LE onto ground    Transfers Overall transfer level: Needs assistance Equipment used: Rolling walker (2 wheeled) Transfers: Sit to/from Stand Sit to Stand: +2 safety/equipment;Min assist Stand pivot transfers: +2 physical assistance;+2 safety/equipment;Min assist       General transfer comment: Pt able to power up into standing partially but when transferring hands to RW his pulls on device and requires min assistance to brace RW to avoid LOB.  Pt required cues for hand and foot placement to improve ease of transfer.      Balance Overall balance assessment: Needs assistance Sitting-balance support: Feet supported;No upper extremity supported Sitting balance-Leahy Scale: Good Sitting balance - Comments: EOB min guard without LOB   Standing balance support: Bilateral upper extremity supported Standing balance-Leahy Scale: Poor Standing balance comment: reliant on bilateral UEs                            ADL either performed or assessed with clinical judgement   ADL Overall ADL's : Needs assistance/impaired                         Toilet Transfer: Minimal assistance;+2 for safety/equipment;Stand-pivot;RW;BSC Toilet Transfer Details (indicate cue type and reason): simulated through recliner transfer Toileting- Clothing Manipulation and Hygiene: Set up;Sitting/lateral lean   Tub/ Shower Transfer: Minimal assistance;+2 for safety/equipment;Anterior/posterior;3 in 1 Tub/Shower Transfer Details (indicate cue type and reason): educated on tub transfer safety and DME for bathing Functional mobility during ADLs: Minimal assistance;Rolling walker;+2 for safety/equipment General ADL Comments: Pt  also provided with leg lifter     Vision       Perception     Praxis      Cognition Arousal/Alertness: Awake/alert Behavior During Therapy: WFL for tasks assessed/performed Overall Cognitive Status: Within Functional Limits for tasks assessed                                           Exercises General Exercises - Lower Extremity Ankle Circles/Pumps: AROM;Both;10 reps;Supine Quad Sets: AROM;Strengthening;Right;Supine Heel Slides: AROM;Right;10 reps;Supine Hip ABduction/ADduction: AROM;Right;10 reps;Supine Straight Leg Raises: AROM;Right;10 reps;Supine   Shoulder Instructions       General Comments      Pertinent Vitals/ Pain       Pain Assessment: Faces Pain Score: 9  Faces Pain Scale: Hurts little more Pain Location: LLE >RLE Mostly L thigh.   Pain Descriptors / Indicators: Throbbing;Pressure;Discomfort;Grimacing;Guarding Pain Intervention(s): Limited activity within patient's tolerance;Repositioned;Ice applied;Patient requesting pain meds-RN notified  Home Living                                          Prior Functioning/Environment              Frequency  Min 3X/week        Progress Toward Goals  OT Goals(current goals can now be found in the care plan section)  Progress towards OT goals: Progressing toward goals  Acute Rehab OT Goals Patient Stated Goal: decrease pain, be home for Christmas OT Goal Formulation: With patient Time For Goal Achievement: 03/21/18 Potential to Achieve Goals: Good  Plan Discharge plan remains appropriate;Frequency remains appropriate    Co-evaluation    PT/OT/SLP Co-Evaluation/Treatment: Yes Reason for Co-Treatment: Other (comment);For patient/therapist safety;To address functional/ADL transfers(Pt with planned early DC for granfather's funeral) PT goals addressed during session: Proper use of DME;Strengthening/ROM OT goals addressed during session: ADL's and self-care;Strengthening/ROM;Proper use of Adaptive equipment and DME      AM-PAC OT "6 Clicks" Daily Activity     Outcome Measure   Help from another person eating meals?: None Help from another person taking care of personal grooming?: None(in sitting) Help from another person toileting, which includes using  toliet, bedpan, or urinal?: A Little Help from another person bathing (including washing, rinsing, drying)?: A Lot Help from another person to put on and taking off regular upper body clothing?: A Little Help from another person to put on and taking off regular lower body clothing?: A Lot 6 Click Score: 18    End of Session Equipment Utilized During Treatment: Gait belt;Rolling walker  OT Visit Diagnosis: Unsteadiness on feet (R26.81);Other abnormalities of gait and mobility (R26.89);Pain Pain - Right/Left: Left(Bilateral) Pain - part of body: Leg   Activity Tolerance Patient tolerated treatment well   Patient Left in chair;with call bell/phone within reach   Nurse Communication Mobility status;Precautions;Weight bearing status        Time: [id number] OT Time Calculation (min): 25 min  Charges: OT General Charges $OT Visit: 1 Visit OT Treatments $Self Care/Home Management : 8-22 mins  Sherryl MangesLaura Rhylen Shaheen OTR/L Acute Rehabilitation Services Pager: [phone] Office: [phone]   Evern BioLaura J Shaunak Kreis 03/09/2018, 11:35 AM

## 2018-03-09 NOTE — Care Management (Signed)
Case manager called referral to [facility], [facility], he accepted patient for HHPT.

## 2018-03-09 NOTE — Care Management (Signed)
Case manager notified by Shon Milletan Phillips, [facility] Liaison that they can not accept BC/BS patient at this time. [facility].

## 2018-03-27 ENCOUNTER — Other Ambulatory Visit: Payer: Self-pay | Admitting: Student

## 2018-03-27 DIAGNOSIS — M25511 Pain in right shoulder: Secondary | ICD-10-CM

## 2018-04-15 ENCOUNTER — Other Ambulatory Visit: Payer: Self-pay

## 2018-04-15 ENCOUNTER — Encounter: Payer: Self-pay | Admitting: Emergency Medicine

## 2018-04-15 ENCOUNTER — Ambulatory Visit
Admission: EM | Admit: 2018-04-15 | Discharge: 2018-04-15 | Disposition: A | Discharge status: Home / Self Care | Payer: BLUE CROSS/BLUE SHIELD | Attending: Family Medicine | Admitting: Family Medicine

## 2018-04-15 DIAGNOSIS — J019 Acute sinusitis, unspecified: Secondary | ICD-10-CM | POA: Diagnosis not present

## 2018-04-15 MED ORDER — AMOXICILLIN-POT CLAVULANATE 875-125 MG PO TABS: 1.0000 | ORAL_TABLET | Freq: Two times a day (BID) | ORAL | 0 refills | Status: DC
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
## 2018-04-15 NOTE — ED Triage Notes (Signed)
Patient stated he has had a cough and nasal congestion x 2 weeks. He states he tested positive for the flu in mid December and took Tamiflu and improved.

## 2018-04-16 ENCOUNTER — Other Ambulatory Visit: Payer: BLUE CROSS/BLUE SHIELD

## 2018-04-30 ENCOUNTER — Ambulatory Visit: Payer: BLUE CROSS/BLUE SHIELD | Attending: Student

## 2018-04-30 DIAGNOSIS — R262 Difficulty in walking, not elsewhere classified: Secondary | ICD-10-CM | POA: Diagnosis present

## 2018-04-30 DIAGNOSIS — R2681 Unsteadiness on feet: Secondary | ICD-10-CM

## 2018-04-30 DIAGNOSIS — M6281 Muscle weakness (generalized): Secondary | ICD-10-CM | POA: Insufficient documentation
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
## 2018-04-30 NOTE — Patient Instructions (Signed)
Access Code: [email or web address]  URL: [email or web address]  Date: 04/30/2018  Prepared by: Ria Comment   Exercises  Supine Heel Slide with Strap - 10 reps - 2 sets - 10 seconds hold - 2x daily - 7x weekly  Modified Thomas Stretch - 3 reps - 30-45 seconds hold - 2x daily - 7x weekly  Clamshell - 10 reps - 2 sets - 3 seconds hold - 1x daily - 7x weekly  Supine Active Straight Leg Raise - 10 reps - 2 sets - 3 seconds hold - 1x daily - 7x weekly  Supine Bridge - 10 reps - 2 sets - 3 seconds hold - 1x daily - 7x weekly

## 2018-05-02 ENCOUNTER — Ambulatory Visit: Payer: BLUE CROSS/BLUE SHIELD

## 2018-05-09 ENCOUNTER — Ambulatory Visit: Payer: BLUE CROSS/BLUE SHIELD

## 2018-05-09 VITALS — BP 121/73 | HR 85

## 2018-05-09 DIAGNOSIS — M6281 Muscle weakness (generalized): Secondary | ICD-10-CM | POA: Diagnosis not present

## 2018-05-09 DIAGNOSIS — R2681 Unsteadiness on feet: Secondary | ICD-10-CM

## 2018-05-09 DIAGNOSIS — R262 Difficulty in walking, not elsewhere classified: Secondary | ICD-10-CM
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
## 2018-05-16 ENCOUNTER — Ambulatory Visit: Payer: BLUE CROSS/BLUE SHIELD

## 2018-05-16 DIAGNOSIS — M6281 Muscle weakness (generalized): Secondary | ICD-10-CM | POA: Diagnosis not present

## 2018-05-16 DIAGNOSIS — R2681 Unsteadiness on feet: Secondary | ICD-10-CM

## 2018-05-16 DIAGNOSIS — R262 Difficulty in walking, not elsewhere classified: Secondary | ICD-10-CM
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
## 2018-05-16 NOTE — Patient Instructions (Signed)
Access Code: [email or web address]  URL: [email or web address]  Date: 05/16/2018  Prepared by: Ria Comment   Exercises  Gastroc Stretch on Wall - 3 reps - 30-45 seconds hold - 3x daily - 7x weekly  Standing Soleus Stretch - 3 reps - 30-45 seconds hold - 3x daily - 7x weekly

## 2018-05-21 ENCOUNTER — Ambulatory Visit: Payer: BLUE CROSS/BLUE SHIELD | Attending: Student

## 2018-05-21 DIAGNOSIS — M6281 Muscle weakness (generalized): Secondary | ICD-10-CM | POA: Insufficient documentation

## 2018-05-21 DIAGNOSIS — R262 Difficulty in walking, not elsewhere classified: Secondary | ICD-10-CM | POA: Insufficient documentation

## 2018-05-21 DIAGNOSIS — R2681 Unsteadiness on feet: Secondary | ICD-10-CM | POA: Diagnosis present
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
## 2018-05-24 ENCOUNTER — Ambulatory Visit: Payer: BLUE CROSS/BLUE SHIELD

## 2018-05-28 ENCOUNTER — Ambulatory Visit: Payer: BLUE CROSS/BLUE SHIELD

## 2018-05-28 DIAGNOSIS — M6281 Muscle weakness (generalized): Secondary | ICD-10-CM | POA: Diagnosis not present

## 2018-05-28 DIAGNOSIS — R262 Difficulty in walking, not elsewhere classified: Secondary | ICD-10-CM

## 2018-05-28 DIAGNOSIS — R2681 Unsteadiness on feet: Secondary | ICD-10-CM
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
## 2018-05-31 ENCOUNTER — Ambulatory Visit: Payer: BLUE CROSS/BLUE SHIELD

## 2018-05-31 ENCOUNTER — Other Ambulatory Visit: Payer: Self-pay

## 2018-05-31 DIAGNOSIS — R2681 Unsteadiness on feet: Secondary | ICD-10-CM

## 2018-05-31 DIAGNOSIS — M6281 Muscle weakness (generalized): Secondary | ICD-10-CM

## 2018-05-31 DIAGNOSIS — R262 Difficulty in walking, not elsewhere classified: Secondary | ICD-10-CM
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
## 2018-06-04 ENCOUNTER — Ambulatory Visit: Payer: BLUE CROSS/BLUE SHIELD

## 2018-06-04 ENCOUNTER — Other Ambulatory Visit: Payer: Self-pay

## 2018-06-04 DIAGNOSIS — M6281 Muscle weakness (generalized): Secondary | ICD-10-CM

## 2018-06-04 DIAGNOSIS — R2681 Unsteadiness on feet: Secondary | ICD-10-CM
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
## 2018-06-06 ENCOUNTER — Other Ambulatory Visit: Payer: BLUE CROSS/BLUE SHIELD

## 2018-06-07 ENCOUNTER — Other Ambulatory Visit: Payer: Self-pay

## 2018-06-07 ENCOUNTER — Ambulatory Visit: Payer: BLUE CROSS/BLUE SHIELD

## 2018-06-07 DIAGNOSIS — M6281 Muscle weakness (generalized): Secondary | ICD-10-CM

## 2018-06-07 DIAGNOSIS — R2681 Unsteadiness on feet: Secondary | ICD-10-CM

## 2018-06-07 DIAGNOSIS — R262 Difficulty in walking, not elsewhere classified: Secondary | ICD-10-CM
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
## 2018-06-11 ENCOUNTER — Ambulatory Visit: Payer: BLUE CROSS/BLUE SHIELD

## 2018-06-12 ENCOUNTER — Ambulatory Visit: Payer: BLUE CROSS/BLUE SHIELD

## 2018-06-12 NOTE — Therapy (Signed)
[place] [facility] MAIN [facility] SERVICES [address] [place], [place], [address] Phone: [phone]   Fax:  [phone]  Patient Details  Name: Wesley Sandoval MRN: [id number] Date of Birth: [birth year] Referring Provider:  No ref. provider found  Encounter Date: 06/12/2018  PT called and left message for pt to notify of outpatient physical therapy clinic closure secondary to COVID-19 precautions. Therapist requested patient return call if needed with any questions or concerns regarding his home physical therapy program while clinic is closed. Therapist provided pt with clinic phone number on voicemail.   Lynnea Maizes PT, DPT, GCS  [name] 06/12/2018, 10:11 AM  [name] [facility] MAIN [facility] SERVICES [address] [place], [place], [address] Phone: [phone]   Fax:  [phone]
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
## 2018-06-13 ENCOUNTER — Ambulatory Visit: Payer: BLUE CROSS/BLUE SHIELD

## 2018-06-18 ENCOUNTER — Ambulatory Visit: Payer: BLUE CROSS/BLUE SHIELD

## 2018-06-20 ENCOUNTER — Other Ambulatory Visit: Payer: Self-pay

## 2018-06-20 ENCOUNTER — Ambulatory Visit: Payer: BLUE CROSS/BLUE SHIELD | Attending: Student

## 2018-06-20 DIAGNOSIS — M6281 Muscle weakness (generalized): Secondary | ICD-10-CM | POA: Insufficient documentation

## 2018-06-20 DIAGNOSIS — R262 Difficulty in walking, not elsewhere classified: Secondary | ICD-10-CM | POA: Diagnosis present

## 2018-06-20 DIAGNOSIS — R2681 Unsteadiness on feet: Secondary | ICD-10-CM | POA: Insufficient documentation
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
## 2018-06-25 ENCOUNTER — Other Ambulatory Visit: Payer: Self-pay

## 2018-06-25 ENCOUNTER — Ambulatory Visit: Payer: BLUE CROSS/BLUE SHIELD

## 2018-06-25 DIAGNOSIS — M6281 Muscle weakness (generalized): Secondary | ICD-10-CM

## 2018-06-25 DIAGNOSIS — R262 Difficulty in walking, not elsewhere classified: Secondary | ICD-10-CM

## 2018-06-25 DIAGNOSIS — R2681 Unsteadiness on feet: Secondary | ICD-10-CM
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
## 2018-06-27 ENCOUNTER — Other Ambulatory Visit: Payer: Self-pay

## 2018-06-27 ENCOUNTER — Ambulatory Visit: Payer: BLUE CROSS/BLUE SHIELD

## 2018-06-27 DIAGNOSIS — R262 Difficulty in walking, not elsewhere classified: Secondary | ICD-10-CM

## 2018-06-27 DIAGNOSIS — M6281 Muscle weakness (generalized): Secondary | ICD-10-CM | POA: Diagnosis not present

## 2018-06-27 DIAGNOSIS — R2681 Unsteadiness on feet: Secondary | ICD-10-CM
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
## 2018-07-02 ENCOUNTER — Ambulatory Visit: Payer: BLUE CROSS/BLUE SHIELD

## 2018-07-04 ENCOUNTER — Ambulatory Visit: Payer: BLUE CROSS/BLUE SHIELD

## 2018-07-04 ENCOUNTER — Other Ambulatory Visit: Payer: Self-pay

## 2018-07-04 DIAGNOSIS — R2681 Unsteadiness on feet: Secondary | ICD-10-CM

## 2018-07-04 DIAGNOSIS — R262 Difficulty in walking, not elsewhere classified: Secondary | ICD-10-CM

## 2018-07-04 DIAGNOSIS — M6281 Muscle weakness (generalized): Secondary | ICD-10-CM

## 2018-07-04 NOTE — Patient Instructions (Addendum)
Supine L hip extension isometrics with L LE straight, and entire L LE supported with 2 pillows to remove stress to L femur and tibia  10x5 second holds for 3 sets. No L LE pain.  Reviewed and given as part of his HEP. Pt demonstrated and verbalized understanding. Handout provided.
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
## 2018-07-09 ENCOUNTER — Ambulatory Visit: Payer: BLUE CROSS/BLUE SHIELD

## 2018-07-09 ENCOUNTER — Other Ambulatory Visit: Payer: Self-pay

## 2018-07-09 DIAGNOSIS — M6281 Muscle weakness (generalized): Secondary | ICD-10-CM

## 2018-07-09 DIAGNOSIS — R2681 Unsteadiness on feet: Secondary | ICD-10-CM

## 2018-07-09 DIAGNOSIS — R262 Difficulty in walking, not elsewhere classified: Secondary | ICD-10-CM
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
## 2018-07-11 ENCOUNTER — Ambulatory Visit: Payer: BLUE CROSS/BLUE SHIELD

## 2018-07-12 ENCOUNTER — Other Ambulatory Visit: Payer: Self-pay

## 2018-07-12 ENCOUNTER — Ambulatory Visit: Payer: BLUE CROSS/BLUE SHIELD

## 2018-07-12 DIAGNOSIS — M6281 Muscle weakness (generalized): Secondary | ICD-10-CM

## 2018-07-12 DIAGNOSIS — R2681 Unsteadiness on feet: Secondary | ICD-10-CM

## 2018-07-12 DIAGNOSIS — R262 Difficulty in walking, not elsewhere classified: Secondary | ICD-10-CM
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
## 2018-07-16 ENCOUNTER — Ambulatory Visit: Payer: BLUE CROSS/BLUE SHIELD

## 2018-07-16 ENCOUNTER — Other Ambulatory Visit: Payer: Self-pay

## 2018-07-16 DIAGNOSIS — R262 Difficulty in walking, not elsewhere classified: Secondary | ICD-10-CM

## 2018-07-16 DIAGNOSIS — R2681 Unsteadiness on feet: Secondary | ICD-10-CM

## 2018-07-16 DIAGNOSIS — M6281 Muscle weakness (generalized): Secondary | ICD-10-CM | POA: Diagnosis not present
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
## 2018-07-18 ENCOUNTER — Other Ambulatory Visit: Payer: Self-pay

## 2018-07-18 ENCOUNTER — Ambulatory Visit: Payer: BLUE CROSS/BLUE SHIELD

## 2018-07-18 DIAGNOSIS — M6281 Muscle weakness (generalized): Secondary | ICD-10-CM

## 2018-07-18 DIAGNOSIS — R262 Difficulty in walking, not elsewhere classified: Secondary | ICD-10-CM

## 2018-07-18 DIAGNOSIS — R2681 Unsteadiness on feet: Secondary | ICD-10-CM
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
## 2018-07-18 NOTE — Patient Instructions (Addendum)
  Supine alternating hip extension with heels on furniture sliders resisting 2 double black theratubes each LE to simulate LE portion of NuStep machine   2 minutes,   1 minute,   30 seconds x2    Decreased L LE pain with weight bearing. Reviewed and given as part of his HEP so long as it does not bother his LE. Theratubes provided. Pt demonstrated and verbalized understanding.

## 2018-07-23 ENCOUNTER — Ambulatory Visit: Payer: BLUE CROSS/BLUE SHIELD | Attending: Student

## 2018-07-23 ENCOUNTER — Other Ambulatory Visit: Payer: Self-pay

## 2018-07-23 DIAGNOSIS — R262 Difficulty in walking, not elsewhere classified: Secondary | ICD-10-CM | POA: Diagnosis present

## 2018-07-23 DIAGNOSIS — M6281 Muscle weakness (generalized): Secondary | ICD-10-CM

## 2018-07-23 DIAGNOSIS — R2681 Unsteadiness on feet: Secondary | ICD-10-CM | POA: Diagnosis present
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
## 2018-07-25 ENCOUNTER — Other Ambulatory Visit: Payer: Self-pay

## 2018-07-25 ENCOUNTER — Ambulatory Visit: Payer: BLUE CROSS/BLUE SHIELD

## 2018-07-25 DIAGNOSIS — R2681 Unsteadiness on feet: Secondary | ICD-10-CM

## 2018-07-25 DIAGNOSIS — R262 Difficulty in walking, not elsewhere classified: Secondary | ICD-10-CM

## 2018-07-25 DIAGNOSIS — M6281 Muscle weakness (generalized): Secondary | ICD-10-CM | POA: Diagnosis not present
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
## 2018-07-30 ENCOUNTER — Ambulatory Visit: Payer: BLUE CROSS/BLUE SHIELD | Admitting: Physical Therapy

## 2018-07-30 ENCOUNTER — Other Ambulatory Visit: Payer: Self-pay

## 2018-07-30 ENCOUNTER — Encounter: Payer: Self-pay | Admitting: Physical Therapy

## 2018-07-30 DIAGNOSIS — M6281 Muscle weakness (generalized): Secondary | ICD-10-CM | POA: Diagnosis not present

## 2018-07-30 DIAGNOSIS — R2681 Unsteadiness on feet: Secondary | ICD-10-CM

## 2018-07-30 DIAGNOSIS — R262 Difficulty in walking, not elsewhere classified: Secondary | ICD-10-CM
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
## 2018-08-01 ENCOUNTER — Ambulatory Visit: Payer: BLUE CROSS/BLUE SHIELD

## 2018-08-06 ENCOUNTER — Ambulatory Visit: Payer: BLUE CROSS/BLUE SHIELD

## 2018-08-06 ENCOUNTER — Other Ambulatory Visit: Payer: Self-pay

## 2018-08-06 DIAGNOSIS — R2681 Unsteadiness on feet: Secondary | ICD-10-CM

## 2018-08-06 DIAGNOSIS — M6281 Muscle weakness (generalized): Secondary | ICD-10-CM

## 2018-08-06 DIAGNOSIS — R262 Difficulty in walking, not elsewhere classified: Secondary | ICD-10-CM
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
## 2018-08-08 ENCOUNTER — Ambulatory Visit: Payer: BLUE CROSS/BLUE SHIELD

## 2018-08-08 ENCOUNTER — Other Ambulatory Visit: Payer: Self-pay

## 2018-08-08 DIAGNOSIS — R262 Difficulty in walking, not elsewhere classified: Secondary | ICD-10-CM

## 2018-08-08 DIAGNOSIS — M6281 Muscle weakness (generalized): Secondary | ICD-10-CM | POA: Diagnosis not present

## 2018-08-08 DIAGNOSIS — R2681 Unsteadiness on feet: Secondary | ICD-10-CM
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
## 2018-08-08 NOTE — Patient Instructions (Signed)
R S/L L hip abduction 10x5 seconds. Difficult  Reviewed and given as part of his HEP, working towards 3 sets. Pt demonstrated and verbalized understanding.

## 2018-08-15 ENCOUNTER — Ambulatory Visit: Payer: BLUE CROSS/BLUE SHIELD

## 2018-08-20 ENCOUNTER — Ambulatory Visit: Payer: BLUE CROSS/BLUE SHIELD

## 2018-08-22 ENCOUNTER — Ambulatory Visit: Payer: BLUE CROSS/BLUE SHIELD | Attending: Student

## 2018-08-27 ENCOUNTER — Ambulatory Visit: Payer: BLUE CROSS/BLUE SHIELD

## 2018-08-27 ENCOUNTER — Telehealth: Payer: Self-pay

## 2018-08-27 NOTE — Telephone Encounter (Signed)
No show. Called patient and left a message pertaining to appointment and a reminder for the next follow up session.   

## 2018-08-29 ENCOUNTER — Ambulatory Visit: Payer: BLUE CROSS/BLUE SHIELD

## 2018-09-03 ENCOUNTER — Telehealth: Payer: Self-pay

## 2018-09-03 ENCOUNTER — Ambulatory Visit: Payer: BLUE CROSS/BLUE SHIELD

## 2018-09-03 NOTE — Telephone Encounter (Signed)
No show. Called patient and left a message checking up on him to see how he is doing. Also informed pt that due to the no show/cancellation policy and due to his recent multiple no shows and cancellations, we have to remove him from the schedule. Pt was requested to give [place] a call back if he would like to schedule more appointments and continue. Phone number [phone]) provided.

## 2018-09-05 ENCOUNTER — Ambulatory Visit: Payer: BLUE CROSS/BLUE SHIELD

## 2018-09-10 ENCOUNTER — Ambulatory Visit: Payer: BLUE CROSS/BLUE SHIELD

## 2019-08-14 ENCOUNTER — Ambulatory Visit: Payer: BC Managed Care – PPO | Attending: Internal Medicine

## 2019-08-14 DIAGNOSIS — Z23 Encounter for immunization: Secondary | ICD-10-CM

## 2019-08-14 NOTE — Progress Notes (Signed)
   Covid-19 Vaccination Clinic  Name:  Wesley Sandoval    MRN: [id number] DOB: 1986-03-06  08/14/2019  [name] was observed post Covid-19 immunization for 15 minutes without incident. He was provided with Vaccine Information Sheet and instruction to access the V-Safe system.   [name] was instructed to call 911 with any severe reactions post vaccine: Marland Kitchen Difficulty breathing  . Swelling of face and throat  . A fast heartbeat  . A bad rash all over body  . Dizziness and weakness   Immunizations Administered    Name Date Dose VIS Date Route   Pfizer COVID-19 Vaccine 08/14/2019  4:55 PM 0.3 mL 05/15/2018 Intramuscular   Manufacturer: [employer], [employer]   Lot: [id number]   NDC: [address]-7

## 2019-09-04 ENCOUNTER — Ambulatory Visit: Payer: Medicaid Other | Attending: Internal Medicine

## 2019-09-04 DIAGNOSIS — Z23 Encounter for immunization: Secondary | ICD-10-CM

## 2019-09-04 NOTE — Progress Notes (Signed)
   Covid-19 Vaccination Clinic  Name:  Wesley Sandoval    MRN: [id number] DOB: Jul 09, 1985  09/04/2019  [name] was observed post Covid-19 immunization for 15 minutes without incident. He was provided with Vaccine Information Sheet and instruction to access the V-Safe system.   [name] was instructed to call 911 with any severe reactions post vaccine: Marland Kitchen Difficulty breathing  . Swelling of face and throat  . A fast heartbeat  . A bad rash all over body  . Dizziness and weakness   Immunizations Administered    Name Date Dose VIS Date Route   Pfizer COVID-19 Vaccine 09/04/2019  3:48 PM 0.3 mL 05/15/2018 Intramuscular   Manufacturer: [employer], [employer]   Lot: [id number]   NDC: [address]-2
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
## 2019-10-24 IMAGING — DX Imaging study
4 series · 4 of 4 positions shown · non-contrast
Comparison: Intraoperative study from earlier on the same day

CLINICAL DATA: ORIF of left leg

EXAM:
LEFT FEMUR PORTABLE 2 VIEWS

[femur ap (1 of 2)]
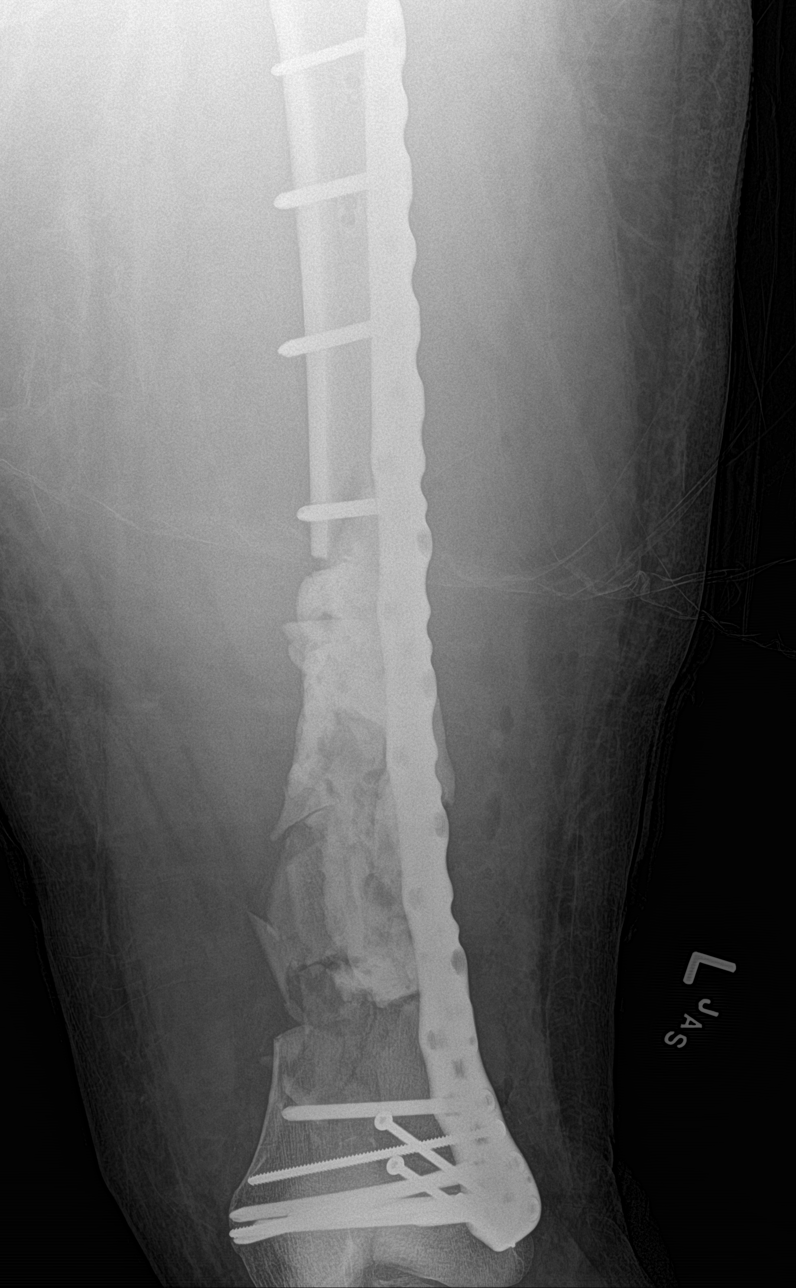

[femur lat (1 of 2)]
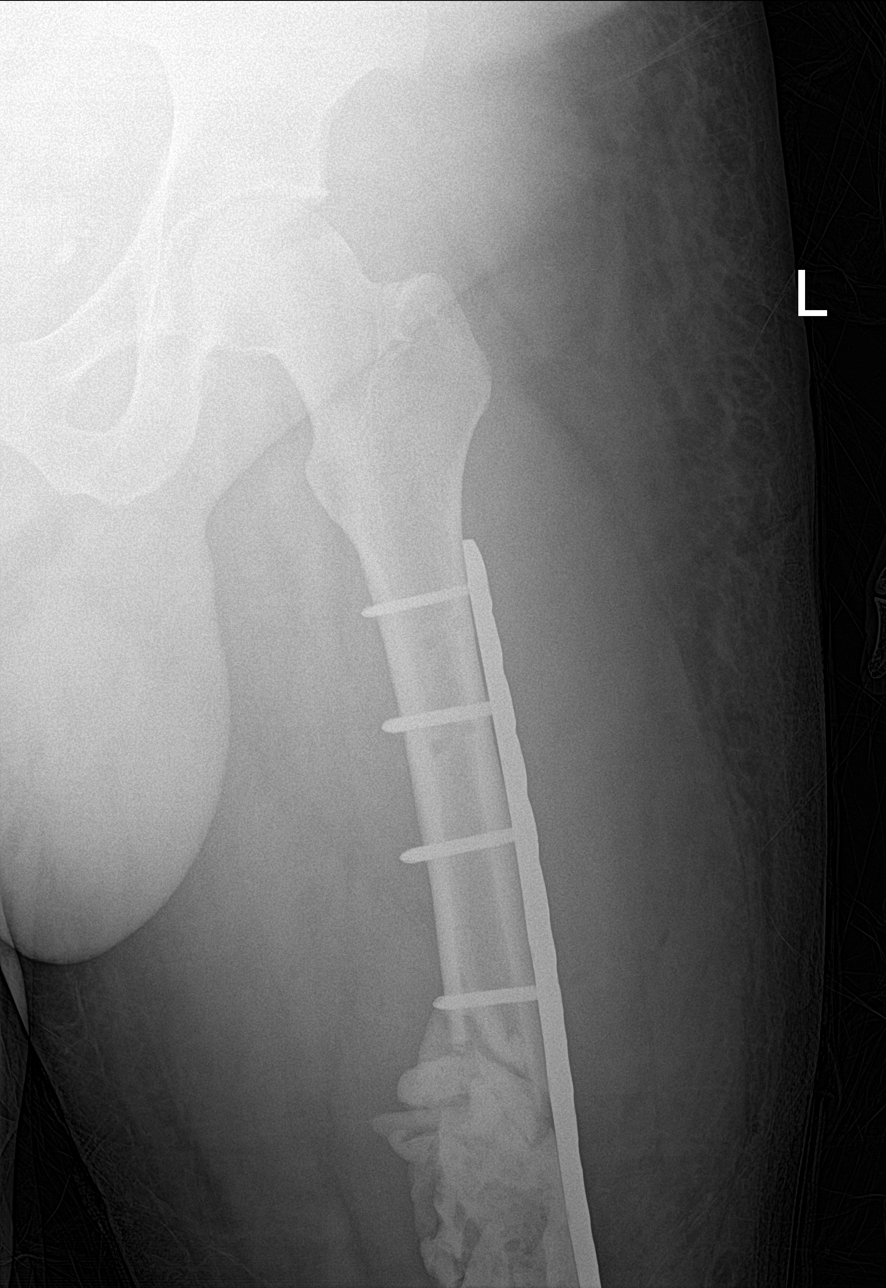

[femur lat (2 of 2)]
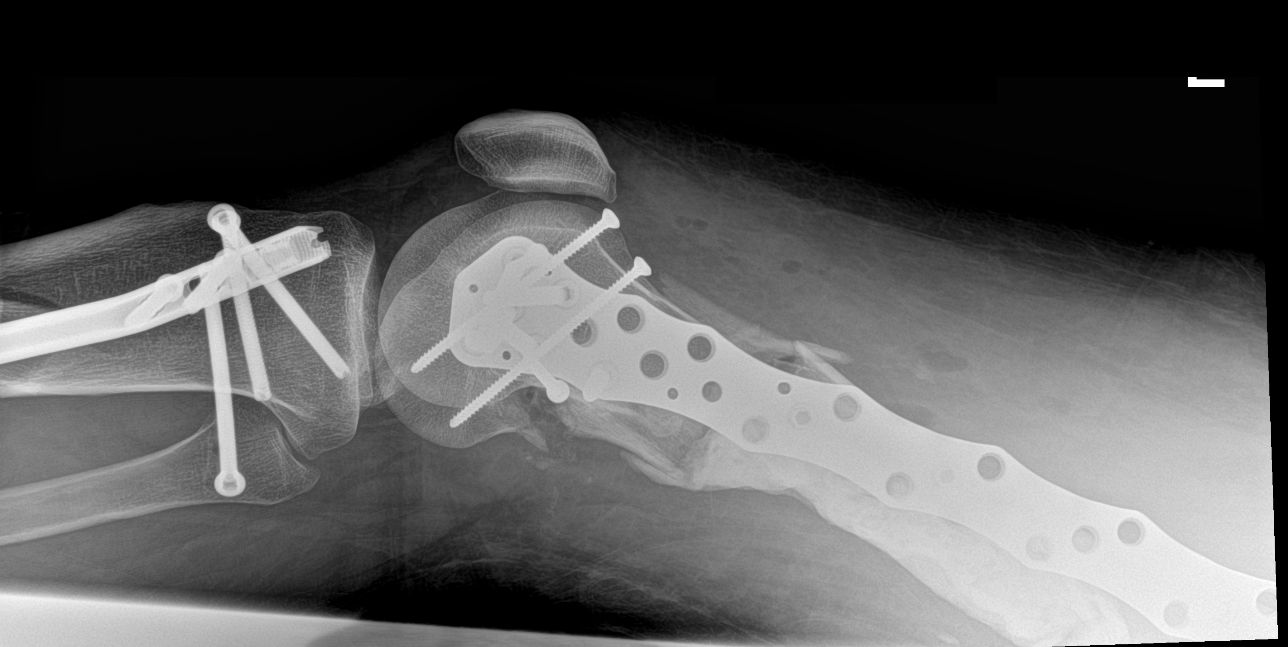

[femur ap (2 of 2)]
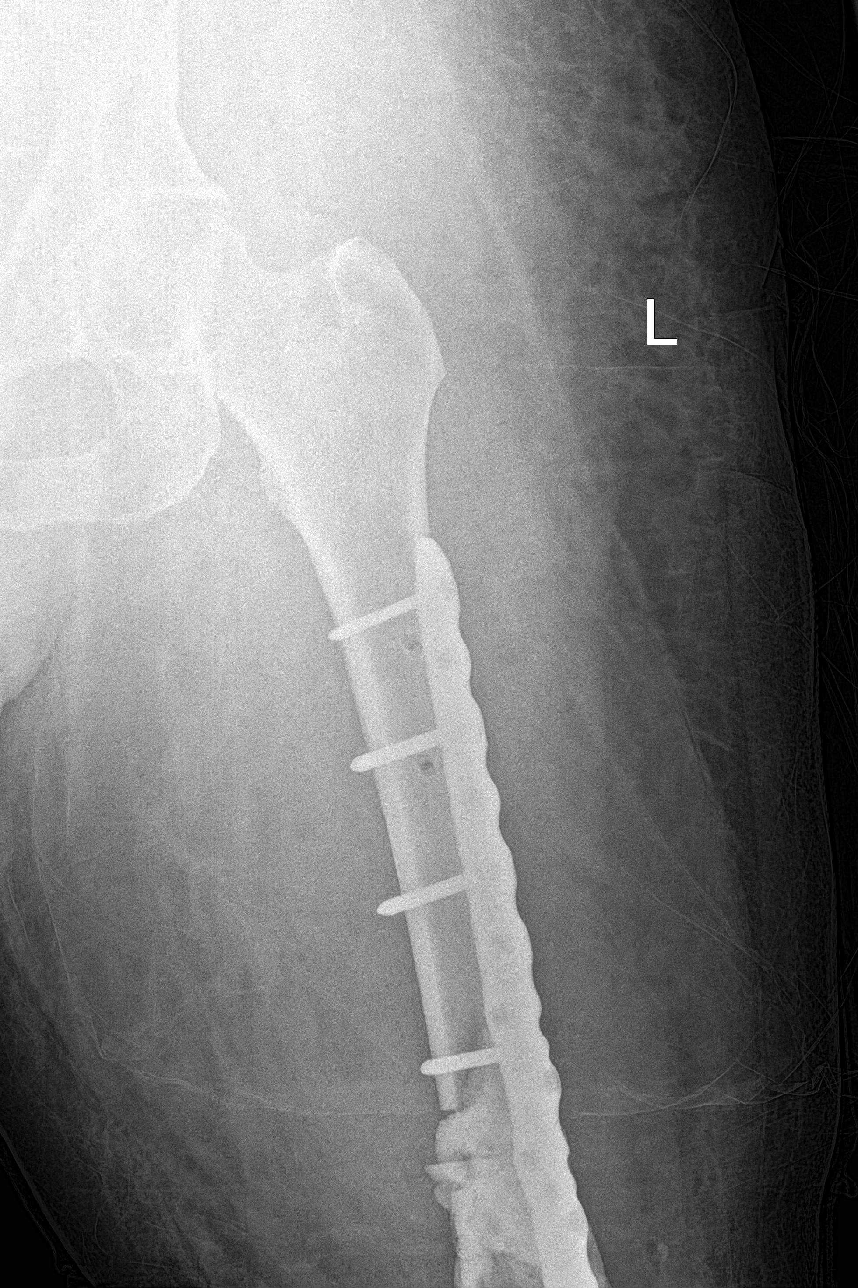

[4 of 4 positions shown; findings below may reference images not displayed]

FINDINGS: Lateral plate and screw fixation across a comminuted segmental
fracture of the distal femoral diaphysis now traversed by lateral
plate and screw fixation. Bone cement is noted at site of segmental
fracture involving the femoral shaft. No immediate postoperative
complications are noted. Expected soft tissue emphysema of the
thigh. Intramedullary nail fixation of the proximal tibia is
partially included on the lateral view.
IMPRESSION: 1. Plate and screw fixation with bone cement material traversing a
segmental comminuted fracture of the distal femoral diaphysis.
2. IM nail fixation of the included proximal tibia.

## 2020-08-16 ENCOUNTER — Emergency Department
Admission: EM | Admit: 2020-08-16 | Discharge: 2020-08-16 | Disposition: A | Discharge status: Home / Self Care | Payer: Medicaid Other | Attending: Emergency Medicine | Admitting: Emergency Medicine

## 2020-08-16 ENCOUNTER — Other Ambulatory Visit: Payer: Self-pay

## 2020-08-16 ENCOUNTER — Emergency Department: Payer: Medicaid Other

## 2020-08-16 DIAGNOSIS — Z87891 Personal history of nicotine dependence: Secondary | ICD-10-CM | POA: Insufficient documentation

## 2020-08-16 DIAGNOSIS — R112 Nausea with vomiting, unspecified: Secondary | ICD-10-CM | POA: Insufficient documentation

## 2020-08-16 DIAGNOSIS — U071 COVID-19: Secondary | ICD-10-CM | POA: Insufficient documentation

## 2020-08-16 DIAGNOSIS — R1010 Upper abdominal pain, unspecified: Secondary | ICD-10-CM | POA: Insufficient documentation

## 2020-08-16 LAB — CBC WITH DIFFERENTIAL/PLATELET
Abs Immature Granulocytes: 0.03 10*3/uL (ref 0.00–0.07)
Basophils Absolute: 0 10*3/uL (ref 0.0–0.1)
Basophils Relative: 0 %
Eosinophils Absolute: 0 10*3/uL (ref 0.0–0.5)
Eosinophils Relative: 1 %
HCT: 45.3 % (ref 39.0–52.0)
Hemoglobin: 14.8 g/dL (ref 13.0–17.0)
Immature Granulocytes: 0 %
Lymphocytes Relative: 8 %
Lymphs Abs: 0.7 10*3/uL (ref 0.7–4.0)
MCH: 28.5 pg (ref 26.0–34.0)
MCHC: 32.7 g/dL (ref 30.0–36.0)
MCV: 87.1 fL (ref 80.0–100.0)
Monocytes Absolute: 1.2 10*3/uL — ABNORMAL HIGH (ref 0.1–1.0)
Monocytes Relative: 14 %
Neutro Abs: 6.4 10*3/uL (ref 1.7–7.7)
Neutrophils Relative %: 77 %
Platelets: 221 10*3/uL (ref 150–400)
RBC: 5.2 MIL/uL (ref 4.22–5.81)
RDW: 13.2 % (ref 11.5–15.5)
WBC: 8.3 10*3/uL (ref 4.0–10.5)
nRBC: 0 % (ref 0.0–0.2)

## 2020-08-16 LAB — COMPREHENSIVE METABOLIC PANEL
ALT: 31 U/L (ref 0–44)
AST: 32 U/L (ref 15–41)
Albumin: 4.4 g/dL (ref 3.5–5.0)
Alkaline Phosphatase: 66 U/L (ref 38–126)
Anion gap: 11 (ref 5–15)
BUN: 14 mg/dL (ref 6–20)
CO2: 21 mmol/L — ABNORMAL LOW (ref 22–32)
Calcium: 9.3 mg/dL (ref 8.9–10.3)
Chloride: 106 mmol/L (ref 98–111)
Creatinine, Ser: 1.1 mg/dL (ref 0.61–1.24)
GFR, Estimated: >60 mL/min (ref >60)
Glucose, Bld: 112 mg/dL — ABNORMAL HIGH (ref 70–99)
Potassium: 3.8 mmol/L (ref 3.5–5.1)
Sodium: 138 mmol/L (ref 135–145)
Total Bilirubin: 1 mg/dL (ref 0.3–1.2)
Total Protein: 7.7 g/dL (ref 6.5–8.1)

## 2020-08-16 LAB — URINALYSIS, COMPLETE (UACMP) WITH MICROSCOPIC
Bacteria, UA: NONE SEEN
Bilirubin Urine: NEGATIVE
Glucose, UA: NEGATIVE mg/dL
Hgb urine dipstick: NEGATIVE
Ketones, ur: 20 mg/dL — AB
Leukocytes,Ua: NEGATIVE
Nitrite: NEGATIVE
Protein, ur: NEGATIVE mg/dL
Specific Gravity, Urine: 1.018 (ref 1.005–1.030)
Squamous Epithelial / HPF: NONE SEEN (ref 0–5)
pH: 7 (ref 5.0–8.0)

## 2020-08-16 LAB — APTT: aPTT: 26 seconds (ref 24–36)

## 2020-08-16 LAB — RESP PANEL BY RT-PCR (FLU A&B, COVID) ARPGX2
Influenza A by PCR: NEGATIVE
Influenza B by PCR: NEGATIVE
SARS Coronavirus 2 by RT PCR: POSITIVE — AB

## 2020-08-16 LAB — PROTIME-INR
INR: 1.1 (ref 0.8–1.2)
Prothrombin Time: 13.7 seconds (ref 11.4–15.2)

## 2020-08-16 LAB — LACTIC ACID, PLASMA: Lactic Acid, Venous: 1.3 mmol/L (ref 0.5–1.9)

## 2020-08-16 LAB — LIPASE, BLOOD: Lipase: 30 U/L (ref 11–51)

## 2020-08-16 MED ORDER — HYDROCOD POLST-CPM POLST ER 10-8 MG/5ML PO SUER: 5.0000 mL | Freq: Two times a day (BID) | ORAL | 0 refills | Status: DC | PRN

## 2020-08-16 MED ORDER — ALBUTEROL SULFATE HFA 108 (90 BASE) MCG/ACT IN AERS: INHALATION_SPRAY | RESPIRATORY_TRACT | 0 refills | Status: DC

## 2020-08-16 MED ORDER — NIRMATRELVIR/RITONAVIR (PAXLOVID)TABLET: 3.0000 | ORAL_TABLET | Freq: Two times a day (BID) | ORAL | 0 refills | Status: AC

## 2020-08-16 MED ORDER — LACTATED RINGERS IV BOLUS (SEPSIS)
1000.0000 mL | Freq: Once | INTRAVENOUS | Status: AC
  Administered 2020-08-16: 1000 mL via INTRAVENOUS

## 2020-08-16 NOTE — ED Triage Notes (Addendum)
Pt states he has abd pain, lower and left upper quadrant pain. Pt states he has been coughing and has "respiratory problems". Pt states he feels like he has also had a fever. Pt states he thinks he had covid. Pt states has felt shob. Pt talking in full complete sentences.

## 2020-08-16 NOTE — ED Notes (Signed)
Dr. York Cerise notified COVID positive

## 2020-08-16 NOTE — ED Notes (Signed)
XR at bedside

## 2020-08-16 NOTE — ED Notes (Signed)
Urine specimen requested. Urinal provided.  
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
## 2020-08-16 NOTE — Discharge Instructions (Signed)
As we discussed, although you have tested positive for COVID-19 (coronavirus), you do not need to be hospitalized at this time.  Read through all the included information including the recommendations from the CDC.  We recommend that you self-quarantine at home with your immediate family only (people with whom you have already been in contact) for about a week after your fever has gone away (without taking medication to make your temperature come down, such as Tylenol (acetaminophen)), after your respiratory symptoms have improved.  You should have as minimal contact as possible with anyone else including close family as per the CDC paperwork guidelines listed below. Follow-up with your doctor by phone or online as needed and return immediately to the emergency department or call 911 only if you develop new or worsening symptoms that concern you.  If you were prescribed any medications, please use them as instructed. ° °You can find up-to-date information about COVID-19 in [place] by calling the [place] Coronavirus Helpline: 1-866-462-3821. You may also call 2-1-1, or 888-892-1162, or additional resources.  You can also find information online at https://www.ncdhhs.gov/divisions/public-health/coronavirus-disease-2019-covid-19-response-[place], or on the Center for Disease Control (CDC) website at https://www.cdc.gov/coronavirus/2019-ncov/index.html. °

## 2020-08-17 LAB — URINE CULTURE

## 2020-08-21 LAB — CULTURE, BLOOD (ROUTINE X 2)
Culture: NO GROWTH
Culture: NO GROWTH
Special Requests: ADEQUATE
Special Requests: ADEQUATE

## 2020-10-31 ENCOUNTER — Emergency Department
Admission: EM | Admit: 2020-10-31 | Discharge: 2020-10-31 | Disposition: A | Discharge status: Home / Self Care | Payer: Medicaid Other | Attending: Emergency Medicine | Admitting: Emergency Medicine

## 2020-10-31 ENCOUNTER — Other Ambulatory Visit: Payer: Self-pay

## 2020-10-31 DIAGNOSIS — Z20822 Contact with and (suspected) exposure to covid-19: Secondary | ICD-10-CM | POA: Insufficient documentation

## 2020-10-31 DIAGNOSIS — R0981 Nasal congestion: Secondary | ICD-10-CM | POA: Insufficient documentation

## 2020-10-31 DIAGNOSIS — J029 Acute pharyngitis, unspecified: Secondary | ICD-10-CM | POA: Insufficient documentation

## 2020-10-31 DIAGNOSIS — R000 Tachycardia, unspecified: Secondary | ICD-10-CM | POA: Insufficient documentation

## 2020-10-31 DIAGNOSIS — Z87891 Personal history of nicotine dependence: Secondary | ICD-10-CM | POA: Insufficient documentation

## 2020-10-31 DIAGNOSIS — R509 Fever, unspecified: Secondary | ICD-10-CM | POA: Insufficient documentation

## 2020-10-31 DIAGNOSIS — M7918 Myalgia, other site: Secondary | ICD-10-CM | POA: Insufficient documentation

## 2020-10-31 DIAGNOSIS — H9203 Otalgia, bilateral: Secondary | ICD-10-CM | POA: Insufficient documentation

## 2020-10-31 DIAGNOSIS — R059 Cough, unspecified: Secondary | ICD-10-CM | POA: Insufficient documentation

## 2020-10-31 LAB — RESP PANEL BY RT-PCR (FLU A&B, COVID) ARPGX2
Influenza A by PCR: NEGATIVE
Influenza B by PCR: NEGATIVE
SARS Coronavirus 2 by RT PCR: NEGATIVE

## 2020-10-31 LAB — MONONUCLEOSIS SCREEN: Mono Screen: NEGATIVE

## 2020-10-31 LAB — GROUP A STREP BY PCR: Group A Strep by PCR: NOT DETECTED

## 2020-10-31 MED ORDER — DEXAMETHASONE 10 MG/ML FOR PEDIATRIC ORAL USE
10.0000 mg | Freq: Once | INTRAMUSCULAR | Status: AC
  Administered 2020-10-31: 10 mg via ORAL
  Filled 2020-10-31: qty 1
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
## 2020-10-31 NOTE — Discharge Instructions (Addendum)
If your Mono test is positive, I will contact you by phone.

## 2020-10-31 NOTE — ED Triage Notes (Signed)
Sore throat and body aches since Monday

## 2020-11-05 ENCOUNTER — Other Ambulatory Visit: Payer: Self-pay

## 2020-11-05 ENCOUNTER — Emergency Department: Payer: Medicaid Other

## 2020-11-05 ENCOUNTER — Emergency Department
Admission: EM | Admit: 2020-11-05 | Discharge: 2020-11-05 | Disposition: A | Discharge status: Home / Self Care | Payer: Medicaid Other | Attending: Emergency Medicine | Admitting: Emergency Medicine

## 2020-11-05 DIAGNOSIS — R42 Dizziness and giddiness: Secondary | ICD-10-CM | POA: Insufficient documentation

## 2020-11-05 DIAGNOSIS — R1031 Right lower quadrant pain: Secondary | ICD-10-CM | POA: Insufficient documentation

## 2020-11-05 DIAGNOSIS — R079 Chest pain, unspecified: Secondary | ICD-10-CM | POA: Insufficient documentation

## 2020-11-05 DIAGNOSIS — R630 Anorexia: Secondary | ICD-10-CM | POA: Insufficient documentation

## 2020-11-05 DIAGNOSIS — Z5321 Procedure and treatment not carried out due to patient leaving prior to being seen by health care provider: Secondary | ICD-10-CM | POA: Insufficient documentation

## 2020-11-05 DIAGNOSIS — R112 Nausea with vomiting, unspecified: Secondary | ICD-10-CM | POA: Insufficient documentation

## 2020-11-05 LAB — TROPONIN I (HIGH SENSITIVITY): Troponin I (High Sensitivity): 3 ng/L (ref <18)

## 2020-11-05 LAB — URINALYSIS, COMPLETE (UACMP) WITH MICROSCOPIC
Bacteria, UA: NONE SEEN
Bilirubin Urine: NEGATIVE
Glucose, UA: NEGATIVE mg/dL
Hgb urine dipstick: NEGATIVE
Ketones, ur: NEGATIVE mg/dL
Leukocytes,Ua: NEGATIVE
Nitrite: NEGATIVE
Protein, ur: NEGATIVE mg/dL
Specific Gravity, Urine: 1.019 (ref 1.005–1.030)
Squamous Epithelial / HPF: NONE SEEN (ref 0–5)
pH: 8 (ref 5.0–8.0)

## 2020-11-05 LAB — COMPREHENSIVE METABOLIC PANEL
ALT: 31 U/L (ref 0–44)
AST: 23 U/L (ref 15–41)
Albumin: 4.2 g/dL (ref 3.5–5.0)
Alkaline Phosphatase: 63 U/L (ref 38–126)
Anion gap: 9 (ref 5–15)
BUN: 14 mg/dL (ref 6–20)
CO2: 23 mmol/L (ref 22–32)
Calcium: 9.4 mg/dL (ref 8.9–10.3)
Chloride: 103 mmol/L (ref 98–111)
Creatinine, Ser: 1 mg/dL (ref 0.61–1.24)
GFR, Estimated: >60 mL/min (ref >60)
Glucose, Bld: 82 mg/dL (ref 70–99)
Potassium: 3.7 mmol/L (ref 3.5–5.1)
Sodium: 135 mmol/L (ref 135–145)
Total Bilirubin: 0.6 mg/dL (ref 0.3–1.2)
Total Protein: 7.5 g/dL (ref 6.5–8.1)

## 2020-11-05 LAB — LIPASE, BLOOD: Lipase: 34 U/L (ref 11–51)

## 2020-11-05 LAB — CBC
HCT: 45.2 % (ref 39.0–52.0)
Hemoglobin: 15.4 g/dL (ref 13.0–17.0)
MCH: 29.8 pg (ref 26.0–34.0)
MCHC: 34.1 g/dL (ref 30.0–36.0)
MCV: 87.6 fL (ref 80.0–100.0)
Platelets: 273 10*3/uL (ref 150–400)
RBC: 5.16 MIL/uL (ref 4.22–5.81)
RDW: 13 % (ref 11.5–15.5)
WBC: 11.5 10*3/uL — ABNORMAL HIGH (ref 4.0–10.5)
nRBC: 0 % (ref 0.0–0.2)

## 2020-11-05 NOTE — ED Notes (Signed)
No answer when called several times from lobby 

## 2020-11-05 NOTE — ED Triage Notes (Signed)
Pt with N/V x 4 days, abdominal pain x 5 days, RLQ and mid chest. Pt with c/o bright blood in stool  after bowel movement and when he wipes x 4 days. Pt with c/o dizziness and decreased appetite.

## 2024-04-16 ENCOUNTER — Ambulatory Visit: Payer: Self-pay | Admitting: Student

## 2024-04-24 ENCOUNTER — Inpatient Hospital Stay (HOSPITAL_COMMUNITY): Admit: 2024-04-24 | Admitting: Student

## 2024-05-01 ENCOUNTER — Encounter: Admission: RE | Payer: Self-pay
